# Patient Record
Sex: Male | Born: 1940 | Race: White | Hispanic: No | Marital: Married | Smoking: Current every day smoker
Health system: Southern US, Community
[De-identification: ages and names within clinical notes are randomized; demographics above are authoritative.]

## PROBLEM LIST (undated history)

## (undated) DIAGNOSIS — I10 Essential (primary) hypertension: Secondary | ICD-10-CM

## (undated) DIAGNOSIS — C801 Malignant (primary) neoplasm, unspecified: Secondary | ICD-10-CM

## (undated) HISTORY — PX: CAROTID ARTERY ANGIOPLASTY: SHX1300

## (undated) HISTORY — PX: MOHS SURGERY: SUR867

## (undated) HISTORY — PX: POLYPECTOMY: SHX149

## (undated) HISTORY — PX: FEMORAL ARTERY STENT: SHX1583

## (undated) HISTORY — PX: ABDOMINAL SURGERY: SHX537

## (undated) HISTORY — PX: LUNG REMOVAL, PARTIAL: SHX233

---

## 1998-09-24 ENCOUNTER — Other Ambulatory Visit: Admission: RE | Admit: 1998-09-24 | Discharge: 1998-09-24 | Payer: Self-pay | Admitting: *Deleted

## 1998-09-30 ENCOUNTER — Ambulatory Visit (HOSPITAL_COMMUNITY): Admission: RE | Admit: 1998-09-30 | Discharge: 1998-09-30 | Payer: Self-pay | Admitting: Gastroenterology

## 2003-09-09 ENCOUNTER — Emergency Department (HOSPITAL_COMMUNITY): Admission: EM | Admit: 2003-09-09 | Discharge: 2003-09-09 | Payer: Self-pay | Admitting: Emergency Medicine

## 2003-10-02 ENCOUNTER — Ambulatory Visit (HOSPITAL_COMMUNITY): Admission: RE | Admit: 2003-10-02 | Discharge: 2003-10-02 | Payer: Self-pay | Admitting: Orthopedic Surgery

## 2003-10-02 ENCOUNTER — Ambulatory Visit (HOSPITAL_BASED_OUTPATIENT_CLINIC_OR_DEPARTMENT_OTHER): Admission: RE | Admit: 2003-10-02 | Discharge: 2003-10-02 | Payer: Self-pay | Admitting: Orthopedic Surgery

## 2010-04-02 ENCOUNTER — Encounter: Payer: Self-pay | Admitting: Internal Medicine

## 2010-07-28 NOTE — Letter (Signed)
Summary: New Patient letter  [facility]  [address] [place], [place] [address]   Phone: [phone]  Fax: [phone]       04/02/2010 MRN: [id number]  Derek Richards [address], [place]  [address]  Dear Mr. PANGILINAN,  Welcome to the Gastroenterology Division at [facility].    You are scheduled to see Dr.  Marina Goodell on 05-12-10 at 3:30pm on the 3rd floor at [facility], [address]  We ask that you try to arrive at our office 15 minutes prior to your appointment time to allow for check-in.  We would like you to complete the enclosed self-administered evaluation form prior to your visit and bring it with you on the day of your appointment.  We will review it with you.  Also, please bring a complete list of all your medications or, if you prefer, bring the medication bottles and we will list them.  Please bring your insurance card so that we may make a copy of it.  If your insurance requires a referral to see a specialist, please bring your referral form from your primary care physician.  Co-payments are due at the time of your visit and may be paid by cash, check or credit card.     Your office visit will consist of a consult with your physician (includes a physical exam), any laboratory testing he/she may order, scheduling of any necessary diagnostic testing (e.g. x-ray, ultrasound, CT-scan), and scheduling of a procedure (e.g. Endoscopy, Colonoscopy) if required.  Please allow enough time on your schedule to allow for any/all of these possibilities.    If you cannot keep your appointment, please call [phone] to cancel or reschedule prior to your appointment date.  This allows [place] the opportunity to schedule an appointment for another patient in need of care.  If you do not cancel or reschedule by 5 p.m. the business day prior to your appointment date, you will be charged a $50.00 late cancellation/no-show fee.    Thank you for choosing [facility] for your medical needs.  We appreciate the opportunity to care for you.  Please visit [place] at our website  to learn more about our practice.                     Sincerely,                                                             The Gastroenterology Division

## 2013-02-13 DIAGNOSIS — C44320 Squamous cell carcinoma of skin of unspecified parts of face: Secondary | ICD-10-CM

## 2013-02-13 HISTORY — DX: Squamous cell carcinoma of skin of unspecified parts of face: C44.320

## 2016-12-26 DIAGNOSIS — G459 Transient cerebral ischemic attack, unspecified: Secondary | ICD-10-CM | POA: Insufficient documentation

## 2016-12-26 HISTORY — DX: Transient cerebral ischemic attack, unspecified: G45.9

## 2016-12-28 DIAGNOSIS — I70203 Unspecified atherosclerosis of native arteries of extremities, bilateral legs: Secondary | ICD-10-CM | POA: Insufficient documentation

## 2016-12-28 DIAGNOSIS — R0989 Other specified symptoms and signs involving the circulatory and respiratory systems: Secondary | ICD-10-CM | POA: Insufficient documentation

## 2016-12-28 HISTORY — DX: Other specified symptoms and signs involving the circulatory and respiratory systems: R09.89

## 2016-12-28 HISTORY — DX: Unspecified atherosclerosis of native arteries of extremities, bilateral legs: I70.203

## 2021-07-09 ENCOUNTER — Encounter: Payer: Self-pay | Admitting: Family

## 2021-07-22 ENCOUNTER — Other Ambulatory Visit: Payer: Self-pay | Admitting: Thoracic Surgery (Cardiothoracic Vascular Surgery)

## 2021-07-22 ENCOUNTER — Other Ambulatory Visit: Payer: Self-pay

## 2021-07-22 ENCOUNTER — Institutional Professional Consult (permissible substitution): Payer: Medicare Other | Admitting: Thoracic Surgery (Cardiothoracic Vascular Surgery)

## 2021-07-22 VITALS — BP 178/107 | HR 79 | Resp 20 | Ht 69.0 in | Wt 199.0 lb

## 2021-07-22 DIAGNOSIS — I10 Essential (primary) hypertension: Secondary | ICD-10-CM | POA: Insufficient documentation

## 2021-07-22 DIAGNOSIS — I6522 Occlusion and stenosis of left carotid artery: Secondary | ICD-10-CM

## 2021-07-22 DIAGNOSIS — Z9889 Other specified postprocedural states: Secondary | ICD-10-CM

## 2021-07-22 DIAGNOSIS — Z720 Tobacco use: Secondary | ICD-10-CM | POA: Diagnosis not present

## 2021-07-22 DIAGNOSIS — I739 Peripheral vascular disease, unspecified: Secondary | ICD-10-CM

## 2021-07-22 DIAGNOSIS — J439 Emphysema, unspecified: Secondary | ICD-10-CM

## 2021-07-22 DIAGNOSIS — J432 Centrilobular emphysema: Secondary | ICD-10-CM

## 2021-07-22 DIAGNOSIS — R911 Solitary pulmonary nodule: Secondary | ICD-10-CM

## 2021-07-22 HISTORY — DX: Occlusion and stenosis of left carotid artery: I65.22

## 2021-07-22 HISTORY — DX: Other specified postprocedural states: Z98.890

## 2021-07-22 HISTORY — DX: Emphysema, unspecified: J43.9

## 2021-07-22 HISTORY — DX: Tobacco use: Z72.0

## 2021-07-22 HISTORY — DX: Peripheral vascular disease, unspecified: I73.9

## 2021-07-22 NOTE — Progress Notes (Signed)
PCP is [name], NP Referring Provider is Gardiner Rhyme, MD  Chief Complaint  Patient presents with   Lung Lesion    PET 1/6, CT chest 12/23    HPI: Derek Richards is sent for consultation regarding a cavitary left upper lobe lung nodule  Derek Richards is an 81 year old man with a history of tobacco abuse, emphysema, hypertension, PAD with right carotid enterectomy and iliac stents.  Derek Richards has smoked 1 to 1-1/2 packs of cigarettes daily since Derek Richards was 81 years old (greater than 70 pack years).  Derek Richards recently started [place] for an annual physical.  With his smoking history Derek Richards was advised to have a low-dose CT for lung cancer screening.  That showed a cavitary nodule in the left upper lobe.  There was no mediastinal or hilar adenopathy.  There was some centrilobular emphysema there was also some aortic atherosclerosis.  There was a large bleb in the right lung medially.  A PET/CT showed the left upper lobe nodule was markedly hypermetabolic.  Derek Richards has referred consideration for surgical resection.  Derek Richards continues to smoke at least a pack a day.  Derek Richards is retired but remains relatively active.  Derek Richards plays golf frequently.  Derek Richards is not having any chest pain, pressure, or tightness.  Derek Richards does get short of breath with heavy exertion like working in the yard.  Derek Richards feels like Derek Richards can walk up 2 flights of stairs without stopping.  Derek Richards does not get short of breath with ambulating at a regular pace.  Derek Richards has an occasional cough.  Denies wheezing.  No change in appetite or weight loss.  No unusual headaches or visual changes.  Patient Active Problem List   Diagnosis Date Noted   Nodule of apex of left lung 08-04-2021   PAD (peripheral artery disease) ([place]) 08/04/2021   Hypertension 08-04-21   Tobacco abuse 08-04-21   Carotid stenosis, asymptomatic, left 08-04-21   History of right-sided carotid endarterectomy 08/04/21   Emphysema lung ([place]) 08/04/21   Family history Mother died of stomach cancer Father died  of throat and lung cancer   Social History Retired Tobacco abuse 1 to 1/2 packs/day x 70 years  Current Outpatient Medications  Medication Sig Dispense Refill   aspirin 81 MG EC tablet Take by mouth.     lisinopril (ZESTRIL) 5 MG tablet Take 5 mg by mouth daily.     Multiple Vitamin (MULTI-VITAMIN) tablet Take 1 tablet by mouth daily.     pantoprazole (PROTONIX) 40 MG tablet Take 40 mg by mouth daily.     No current facility-administered medications for this visit.      Review of Systems  Constitutional:  Negative for activity change, appetite change and unexpected weight change.  HENT:  Positive for dental problem (Dentures) and hearing loss. Negative for trouble swallowing and voice change.   Eyes:  Negative for visual disturbance.  Respiratory:  Positive for cough and shortness of breath. Negative for chest tightness and wheezing.   Cardiovascular:  Negative for chest pain and leg swelling.  Gastrointestinal:  Positive for abdominal pain (Reflux).  Genitourinary:  Negative for difficulty urinating and dysuria.  Musculoskeletal:  Positive for arthralgias and myalgias.  Neurological:  Negative for syncope and weakness.  Hematological:  Negative for adenopathy. Does not bruise/bleed easily.  All other systems reviewed and are negative.  BP (!) 178/107 (BP Location: Left Arm, Patient Position: Sitting)    Pulse 79    Resp 20    Ht 5\' 9"  (1.753  m)    Wt 199 lb (90.3 kg)    SpO2 95%    BMI 29.39 kg/m  Physical Exam Vitals reviewed.  Constitutional:      General: Derek Richards is not in acute distress.    Appearance: Normal appearance.  HENT:     Head: Normocephalic and atraumatic.  Eyes:     General: No scleral icterus.    Extraocular Movements: Extraocular movements intact.  Neck:     Vascular: Carotid bruit (Left bruit) present.     Comments: Well-healed scar right neck Cardiovascular:     Rate and Rhythm: Normal rate and regular rhythm.     Pulses: Normal pulses.     Heart  sounds: Normal heart sounds. No murmur heard.   No friction rub. No gallop.  Pulmonary:     Effort: No respiratory distress.     Breath sounds: Normal breath sounds. No wheezing or rales.  Abdominal:     General: There is no distension.     Palpations: Abdomen is soft.     Tenderness: There is no abdominal tenderness.  Musculoskeletal:        General: No swelling.     Cervical back: Neck supple.  Skin:    General: Skin is warm and dry.  Neurological:     General: No focal deficit present.     Mental Status: Derek Richards is alert and oriented to person, place, and time.     Cranial Nerves: No cranial nerve deficit.     Motor: No weakness.     Diagnostic Tests: I personally reviewed his CT and PET/CT.  They were done at [facility] and are accessible through our PACS system.    The CT shows a 2.4 cm spiculated cavitary mass in the left upper lobe.  No mediastinal or hilar adenopathy.  There is aortic atherosclerosis.  The PET/CT shows the left upper lobe nodule is markedly hypermetabolic.  No evidence of regional or distant metastatic disease.  Impression: Derek Richards is an 81 year old man with a history of tobacco abuse, emphysema, hypertension, PAD with right carotid enterectomy and iliac stents.  Derek Richards recently was found to have a cavitary left upper lobe lung nodule that is markedly hypermetabolic on PET/CT.  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  Infectious and inflammatory nodules are in the differential but are far less likely.  I discussed potential treatment options with Derek Richards and his wife.  They understand the options would be surgical resection versus stereotactic radiation.  We discussed the relative advantages and disadvantages of each approach.  Derek Richards strongly would prefer surgical resection.  I described the proposed procedure to him.  The plan would be to do a robotic left VATS for wedge resection and then proceed  with robotic left upper lobectomy if the frozen section shows cancer.  We discussed the general nature of the procedure including the need for general anesthesia, the incisions to be used, the use of a drainage tube postoperatively, the expected hospital stay, and the overall recovery.  I informed them of the indications, risks, benefits, and alternatives.  They understand the risks include, but not limited to death, MI, DVT, PE, bleeding, possible need for transfusion, possible conversion to open, infection, prolonged air leak, cardiac arrhythmias, pain, as well as the possibility of other unforeseeable complications.  Derek Richards does need pulmonary function testing to determine if Derek Richards has adequate pulmonary reserve.  Based on his reported activity tolerance, that should not be  an issue.  Tobacco abuse-has smoked since age 49.  Derek Richards expresses no desire to quit.  I strongly encouraged him to quit for his cardiovascular and pulmonary health.  Derek Richards has PAD, lung cancer, and emphysema.  Derek Richards understands that if Derek Richards continues to smoke it will increase his risk for perioperative complications.  Plan: Pulmonary function testing with and without bronchodilators Tentatively plan for robotic left VATS for wedge resection and possible upper lobectomy on Monday, 08/10/2021  I spent over 45 minutes in review of records, images, and in consultation with Derek Richards today. Derek Nakayama, MD [facility] [phone]

## 2021-07-22 NOTE — H&P (View-Only) (Signed)
PCP is [name], NP Referring Provider is Gardiner Rhyme, MD  Chief Complaint  Patient presents with   Lung Lesion    PET 1/6, CT chest 12/23    HPI: Derek Richards is sent for consultation regarding a cavitary left upper lobe lung nodule  Derek Richards is an 81 year old man with a history of tobacco abuse, emphysema, hypertension, PAD with right carotid enterectomy and iliac stents.  He has smoked 1 to 1-1/2 packs of cigarettes daily since he was 81 years old (greater than 70 pack years).  He recently started [place] for an annual physical.  With his smoking history he was advised to have a low-dose CT for lung cancer screening.  That showed a cavitary nodule in the left upper lobe.  There was no mediastinal or hilar adenopathy.  There was some centrilobular emphysema there was also some aortic atherosclerosis.  There was a large bleb in the right lung medially.  A PET/CT showed the left upper lobe nodule was markedly hypermetabolic.  He has referred consideration for surgical resection.  He continues to smoke at least a pack a day.  He is retired but remains relatively active.  He plays golf frequently.  He is not having any chest pain, pressure, or tightness.  He does get short of breath with heavy exertion like working in the yard.  He feels like he can walk up 2 flights of stairs without stopping.  He does not get short of breath with ambulating at a regular pace.  He has an occasional cough.  Denies wheezing.  No change in appetite or weight loss.  No unusual headaches or visual changes.  Patient Active Problem List   Diagnosis Date Noted   Nodule of apex of left lung Aug 19, 2021   PAD (peripheral artery disease) ([place]) August 19, 2021   Hypertension 08-19-21   Tobacco abuse 08/19/21   Carotid stenosis, asymptomatic, left 19-Aug-2021   History of right-sided carotid endarterectomy 2021-08-19   Emphysema lung ([place]) August 19, 2021   Family history Mother died of stomach cancer Father died  of throat and lung cancer   Social History Retired Tobacco abuse 1 to 1/2 packs/day x 70 years  Current Outpatient Medications  Medication Sig Dispense Refill   aspirin 81 MG EC tablet Take by mouth.     lisinopril (ZESTRIL) 5 MG tablet Take 5 mg by mouth daily.     Multiple Vitamin (MULTI-VITAMIN) tablet Take 1 tablet by mouth daily.     pantoprazole (PROTONIX) 40 MG tablet Take 40 mg by mouth daily.     No current facility-administered medications for this visit.      Review of Systems  Constitutional:  Negative for activity change, appetite change and unexpected weight change.  HENT:  Positive for dental problem (Dentures) and hearing loss. Negative for trouble swallowing and voice change.   Eyes:  Negative for visual disturbance.  Respiratory:  Positive for cough and shortness of breath. Negative for chest tightness and wheezing.   Cardiovascular:  Negative for chest pain and leg swelling.  Gastrointestinal:  Positive for abdominal pain (Reflux).  Genitourinary:  Negative for difficulty urinating and dysuria.  Musculoskeletal:  Positive for arthralgias and myalgias.  Neurological:  Negative for syncope and weakness.  Hematological:  Negative for adenopathy. Does not bruise/bleed easily.  All other systems reviewed and are negative.  BP (!) 178/107 (BP Location: Left Arm, Patient Position: Sitting)    Pulse 79    Resp 20    Ht 5\' 9"  (1.753  m)    Wt 199 lb (90.3 kg)    SpO2 95%    BMI 29.39 kg/m  Physical Exam Vitals reviewed.  Constitutional:      General: He is not in acute distress.    Appearance: Normal appearance.  HENT:     Head: Normocephalic and atraumatic.  Eyes:     General: No scleral icterus.    Extraocular Movements: Extraocular movements intact.  Neck:     Vascular: Carotid bruit (Left bruit) present.     Comments: Well-healed scar right neck Cardiovascular:     Rate and Rhythm: Normal rate and regular rhythm.     Pulses: Normal pulses.     Heart  sounds: Normal heart sounds. No murmur heard.   No friction rub. No gallop.  Pulmonary:     Effort: No respiratory distress.     Breath sounds: Normal breath sounds. No wheezing or rales.  Abdominal:     General: There is no distension.     Palpations: Abdomen is soft.     Tenderness: There is no abdominal tenderness.  Musculoskeletal:        General: No swelling.     Cervical back: Neck supple.  Skin:    General: Skin is warm and dry.  Neurological:     General: No focal deficit present.     Mental Status: He is alert and oriented to person, place, and time.     Cranial Nerves: No cranial nerve deficit.     Motor: No weakness.     Diagnostic Tests: I personally reviewed his CT and PET/CT.  They were done at [facility] and are accessible through our PACS system.    The CT shows a 2.4 cm spiculated cavitary mass in the left upper lobe.  No mediastinal or hilar adenopathy.  There is aortic atherosclerosis.  The PET/CT shows the left upper lobe nodule is markedly hypermetabolic.  No evidence of regional or distant metastatic disease.  Impression: Derek Richards is an 81 year old man with a history of tobacco abuse, emphysema, hypertension, PAD with right carotid enterectomy and iliac stents.  He recently was found to have a cavitary left upper lobe lung nodule that is markedly hypermetabolic on PET/CT.  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  Infectious and inflammatory nodules are in the differential but are far less likely.  I discussed potential treatment options with Mr. Shere and his wife.  They understand the options would be surgical resection versus stereotactic radiation.  We discussed the relative advantages and disadvantages of each approach.  He strongly would prefer surgical resection.  I described the proposed procedure to him.  The plan would be to do a robotic left VATS for wedge resection and then proceed  with robotic left upper lobectomy if the frozen section shows cancer.  We discussed the general nature of the procedure including the need for general anesthesia, the incisions to be used, the use of a drainage tube postoperatively, the expected hospital stay, and the overall recovery.  I informed them of the indications, risks, benefits, and alternatives.  They understand the risks include, but not limited to death, MI, DVT, PE, bleeding, possible need for transfusion, possible conversion to open, infection, prolonged air leak, cardiac arrhythmias, pain, as well as the possibility of other unforeseeable complications.  He does need pulmonary function testing to determine if he has adequate pulmonary reserve.  Based on his reported activity tolerance, that should not be  an issue.  Tobacco abuse-has smoked since age 9.  He expresses no desire to quit.  I strongly encouraged him to quit for his cardiovascular and pulmonary health.  He has PAD, lung cancer, and emphysema.  He understands that if he continues to smoke it will increase his risk for perioperative complications.  Plan: Pulmonary function testing with and without bronchodilators Tentatively plan for robotic left VATS for wedge resection and possible upper lobectomy on Monday, 08/10/2021  I spent over 45 minutes in review of records, images, and in consultation with Mr. Larusso today. [place] Nakayama, MD [facility] [phone]

## 2021-07-23 ENCOUNTER — Encounter: Payer: Self-pay | Admitting: Family

## 2021-07-23 ENCOUNTER — Encounter: Payer: Self-pay | Admitting: *Deleted

## 2021-07-23 ENCOUNTER — Other Ambulatory Visit: Payer: Self-pay | Admitting: *Deleted

## 2021-07-23 DIAGNOSIS — R911 Solitary pulmonary nodule: Secondary | ICD-10-CM

## 2021-07-29 HISTORY — PX: LUNG REMOVAL, PARTIAL: SHX233

## 2021-08-05 NOTE — Progress Notes (Signed)
Surgical Instructions    Your procedure is scheduled on Monday February 13th.  Report to [facility] Main Entrance "A" at 9:30 A.M., then check in with the Admitting office.  Call this number if you have problems the morning of surgery:  [phone]   If you have any questions prior to your surgery date call [phone]: Open Monday-Friday 8am-4pm    Remember:  Do not eat or drink after midnight the night before your surgery     Take these medicines the morning of surgery with A SIP OF WATER: lisinopril (ZESTRIL) 5 MG tablet pantoprazole (PROTONIX) 40 MG tablet   Follow your surgeon's instructions on when to stop Aspirin.  If no instructions were given by your surgeon then you will need to call the office to get those instructions.     As of today, STOP taking any Aspirin (unless otherwise instructed by your surgeon) Aleve, Naproxen, Ibuprofen, Motrin, Advil, Goody's, BC's, all herbal medications, fish oil, and all vitamins.           Do not wear jewelry  Do not wear lotions, powders, colognes, or deodorant. Do not shave 48 hours prior to surgery.  Men may shave face and neck. Do not bring valuables to the hospital. Do not wear nail polish, gel polish, artificial nails, or any other type of covering on natural nails (fingers and toes) If you have artificial nails or gel coating that need to be removed by a nail salon, please have this removed prior to surgery. Artificial nails or gel coating may interfere with anesthesia's ability to adequately monitor your vital signs.  [facility] is not responsible for any belongings or valuables. .   Do NOT Smoke (Tobacco/Vaping)  24 hours prior to your procedure  If you use a CPAP at night, you may bring your mask for your overnight stay.   Contacts, glasses, hearing aids, dentures or partials may not be worn into surgery, please bring cases for these belongings   For patients admitted to the hospital, discharge time will be determined  by your treatment team.   Patients discharged the day of surgery will not be allowed to drive home, and someone needs to stay with them for 24 hours.  NO VISITORS WILL BE ALLOWED IN PRE-OP WHERE PATIENTS ARE PREPPED FOR SURGERY.  ONLY 1 SUPPORT PERSON MAY BE PRESENT IN THE WAITING ROOM WHILE YOU ARE IN SURGERY.  IF YOU ARE TO BE ADMITTED, ONCE YOU ARE IN YOUR ROOM YOU WILL BE ALLOWED TWO (2) VISITORS. 1 (ONE) VISITOR MAY STAY OVERNIGHT BUT MUST ARRIVE TO THE ROOM BY 8pm.  Minor children may have two parents present. Special consideration for safety and communication needs will be reviewed on a case by case basis.  Special instructions:    Oral Hygiene is also important to reduce your risk of infection.  Remember - BRUSH YOUR TEETH THE MORNING OF SURGERY WITH YOUR REGULAR TOOTHPASTE   [place]- Preparing For Surgery  Before surgery, you can play an important role. Because skin is not sterile, your skin needs to be as free of germs as possible. You can reduce the number of germs on your skin by washing with CHG (chlorahexidine gluconate) Soap before surgery.  CHG is an antiseptic cleaner which kills germs and bonds with the skin to continue killing germs even after washing.     Please do not use if you have an allergy to CHG or antibacterial soaps. If your skin becomes reddened/irritated stop using the CHG.  Do not shave (including legs and underarms) for at least 48 hours prior to first CHG shower. It is OK to shave your face.  Please follow these instructions carefully.     Shower the NIGHT BEFORE SURGERY and the MORNING OF SURGERY with CHG Soap.   If you chose to wash your hair, wash your hair first as usual with your normal shampoo. After you shampoo, rinse your hair and body thoroughly to remove the shampoo.  Then [employer] and genitals (private parts) with your normal soap and rinse thoroughly to remove soap.  After that Use CHG Soap as you would any other liquid soap. You can apply  CHG directly to the skin and wash gently with a scrungie or a clean washcloth.   Apply the CHG Soap to your body ONLY FROM THE NECK DOWN.  Do not use on open wounds or open sores. Avoid contact with your eyes, ears, mouth and genitals (private parts). Wash Face and genitals (private parts)  with your normal soap.   Wash thoroughly, paying special attention to the area where your surgery will be performed.  Thoroughly rinse your body with warm water from the neck down.  DO NOT shower/wash with your normal soap after using and rinsing off the CHG Soap.  Pat yourself dry with a CLEAN TOWEL.  Wear CLEAN PAJAMAS to bed the night before surgery  Place CLEAN SHEETS on your bed the night before your surgery  DO NOT SLEEP WITH PETS.   Day of Surgery:  Take a shower with CHG soap. Wear Clean/Comfortable clothing the morning of surgery Do not apply any deodorants/lotions.   Remember to brush your teeth WITH YOUR REGULAR TOOTHPASTE.    COVID testing  If you are going to stay overnight or be admitted after your procedure/surgery and require a pre-op COVID test, please follow these instructions after your COVID test   You are not required to quarantine however you are required to wear a well-fitting mask when you are out and around people not in your household.  If your mask becomes wet or soiled, replace with a new one.  Wash your hands often with soap and water for 20 seconds or clean your hands with an alcohol-based hand sanitizer that contains at least 60% alcohol.  Do not share personal items.  Notify your provider: if you are in close contact with someone who has COVID  or if you develop a fever of 100.4 or greater, sneezing, cough, sore throat, shortness of breath or body aches.    Please read over the following fact sheets that you were given.

## 2021-08-06 ENCOUNTER — Ambulatory Visit (HOSPITAL_COMMUNITY)
Admission: RE | Admit: 2021-08-06 | Discharge: 2021-08-06 | Disposition: A | Discharge status: Home / Self Care | Payer: Medicare Other | Source: Ambulatory Visit | Attending: Thoracic Surgery (Cardiothoracic Vascular Surgery) | Admitting: Thoracic Surgery (Cardiothoracic Vascular Surgery)

## 2021-08-06 ENCOUNTER — Other Ambulatory Visit: Payer: Self-pay

## 2021-08-06 ENCOUNTER — Encounter (HOSPITAL_COMMUNITY): Payer: Self-pay

## 2021-08-06 ENCOUNTER — Encounter (HOSPITAL_COMMUNITY)
Admission: RE | Admit: 2021-08-06 | Discharge: 2021-08-06 | Disposition: A | Discharge status: Home / Self Care | Payer: Medicare Other | Source: Ambulatory Visit | Attending: Thoracic Surgery (Cardiothoracic Vascular Surgery) | Admitting: Thoracic Surgery (Cardiothoracic Vascular Surgery)

## 2021-08-06 ENCOUNTER — Ambulatory Visit (HOSPITAL_COMMUNITY): Admission: RE | Admit: 2021-08-06 | Payer: Medicare Other | Source: Ambulatory Visit

## 2021-08-06 VITALS — BP 170/90 | HR 86 | Temp 98.7°F | Resp 18 | Ht 68.0 in | Wt 200.3 lb

## 2021-08-06 DIAGNOSIS — F17210 Nicotine dependence, cigarettes, uncomplicated: Secondary | ICD-10-CM | POA: Diagnosis not present

## 2021-08-06 DIAGNOSIS — J439 Emphysema, unspecified: Secondary | ICD-10-CM | POA: Diagnosis not present

## 2021-08-06 DIAGNOSIS — R911 Solitary pulmonary nodule: Secondary | ICD-10-CM

## 2021-08-06 DIAGNOSIS — Z20822 Contact with and (suspected) exposure to covid-19: Secondary | ICD-10-CM | POA: Insufficient documentation

## 2021-08-06 DIAGNOSIS — I10 Essential (primary) hypertension: Secondary | ICD-10-CM | POA: Diagnosis not present

## 2021-08-06 DIAGNOSIS — J984 Other disorders of lung: Secondary | ICD-10-CM | POA: Insufficient documentation

## 2021-08-06 DIAGNOSIS — I739 Peripheral vascular disease, unspecified: Secondary | ICD-10-CM | POA: Insufficient documentation

## 2021-08-06 DIAGNOSIS — Z01818 Encounter for other preprocedural examination: Secondary | ICD-10-CM | POA: Diagnosis not present

## 2021-08-06 HISTORY — DX: Malignant (primary) neoplasm, unspecified: C80.1

## 2021-08-06 HISTORY — DX: Essential (primary) hypertension: I10

## 2021-08-06 LAB — BLOOD GAS, ARTERIAL
Acid-Base Excess: 0.4 mmol/L (ref 0.0–2.0)
Bicarbonate: 24.4 mmol/L (ref 20.0–28.0)
FIO2: 21
O2 Saturation: 97.3 %
Patient temperature: 37
pCO2 arterial: 39.1 mmHg (ref 32.0–48.0)
pH, Arterial: 7.412 (ref 7.350–7.450)
pO2, Arterial: 101 mmHg (ref 83.0–108.0)

## 2021-08-06 LAB — URINALYSIS, ROUTINE W REFLEX MICROSCOPIC
Bilirubin Urine: NEGATIVE
Glucose, UA: NEGATIVE mg/dL
Hgb urine dipstick: NEGATIVE
Ketones, ur: NEGATIVE mg/dL
Leukocytes,Ua: NEGATIVE
Nitrite: NEGATIVE
Protein, ur: NEGATIVE mg/dL
Specific Gravity, Urine: 1.002 — ABNORMAL LOW (ref 1.005–1.030)
pH: 6 (ref 5.0–8.0)

## 2021-08-06 LAB — COMPREHENSIVE METABOLIC PANEL
ALT: 23 U/L (ref 0–44)
AST: 24 U/L (ref 15–41)
Albumin: 4 g/dL (ref 3.5–5.0)
Alkaline Phosphatase: 61 U/L (ref 38–126)
Anion gap: 11 (ref 5–15)
BUN: 9 mg/dL (ref 8–23)
CO2: 22 mmol/L (ref 22–32)
Calcium: 9 mg/dL (ref 8.9–10.3)
Chloride: 96 mmol/L — ABNORMAL LOW (ref 98–111)
Creatinine, Ser: 0.95 mg/dL (ref 0.61–1.24)
GFR, Estimated: >60 mL/min (ref >60)
Glucose, Bld: 89 mg/dL (ref 70–99)
Potassium: 4.4 mmol/L (ref 3.5–5.1)
Sodium: 129 mmol/L — ABNORMAL LOW (ref 135–145)
Total Bilirubin: 0.6 mg/dL (ref 0.3–1.2)
Total Protein: 6.6 g/dL (ref 6.5–8.1)

## 2021-08-06 LAB — SURGICAL PCR SCREEN
MRSA, PCR: NEGATIVE
Staphylococcus aureus: NEGATIVE

## 2021-08-06 LAB — TYPE AND SCREEN
ABO/RH(D): A POS
Antibody Screen: NEGATIVE

## 2021-08-06 LAB — CBC
HCT: 43.9 % (ref 39.0–52.0)
Hemoglobin: 15.6 g/dL (ref 13.0–17.0)
MCH: 32.8 pg (ref 26.0–34.0)
MCHC: 35.5 g/dL (ref 30.0–36.0)
MCV: 92.2 fL (ref 80.0–100.0)
Platelets: 223 10*3/uL (ref 150–400)
RBC: 4.76 MIL/uL (ref 4.22–5.81)
RDW: 12.3 % (ref 11.5–15.5)
WBC: 6.1 10*3/uL (ref 4.0–10.5)
nRBC: 0 % (ref 0.0–0.2)

## 2021-08-06 LAB — APTT: aPTT: 25 seconds (ref 24–36)

## 2021-08-06 LAB — PROTIME-INR
INR: 0.9 (ref 0.8–1.2)
Prothrombin Time: 11.7 seconds (ref 11.4–15.2)

## 2021-08-06 NOTE — Progress Notes (Addendum)
Surgical Instructions    Your procedure is scheduled on Monday February 13th.  Report to [facility] Main Entrance "A" at 9:30 A.M., then check in with the Admitting office.  Call this number if you have problems the morning of surgery:  [phone]   If you have any questions prior to your surgery date call [phone]: Open Monday-Friday 8am-4pm    Remember:  Do not eat or drink after midnight the night before your surgery     Take these medicines the morning of surgery with A SIP OF WATER: pantoprazole (PROTONIX) 40 MG tablet   Follow your surgeon's instructions on when to stop Aspirin.  If no instructions were given by your surgeon then you will need to call the office to get those instructions.     As of today, STOP taking any Aspirin (unless otherwise instructed by your surgeon) Aleve, Naproxen, Ibuprofen, Motrin, Advil, Goody's, BC's, all herbal medications, fish oil, and all vitamins.           Do not wear jewelry  Do not wear lotions, powders, colognes, or deodorant. Do not shave 48 hours prior to surgery.  Men may shave face and neck. Do not bring valuables to the hospital.  [facility] is not responsible for any belongings or valuables. .   Do NOT Smoke (Tobacco/Vaping)  24 hours prior to your procedure  If you use a CPAP at night, you may bring your mask for your overnight stay.   Contacts, glasses, hearing aids, dentures or partials may not be worn into surgery, please bring cases for these belongings   For patients admitted to the hospital, discharge time will be determined by your treatment team.   Patients discharged the day of surgery will not be allowed to drive home, and someone needs to stay with them for 24 hours.  NO VISITORS WILL BE ALLOWED IN PRE-OP WHERE PATIENTS ARE PREPPED FOR SURGERY.  ONLY 1 SUPPORT PERSON MAY BE PRESENT IN THE WAITING ROOM WHILE YOU ARE IN SURGERY.  IF YOU ARE TO BE ADMITTED, ONCE YOU ARE IN YOUR ROOM YOU WILL BE ALLOWED TWO  (2) VISITORS. 1 (ONE) VISITOR MAY STAY OVERNIGHT BUT MUST ARRIVE TO THE ROOM BY 8pm.  Minor children may have two parents present. Special consideration for safety and communication needs will be reviewed on a case by case basis.  Special instructions:    Oral Hygiene is also important to reduce your risk of infection.  Remember - BRUSH YOUR TEETH THE MORNING OF SURGERY WITH YOUR REGULAR TOOTHPASTE   [place]- Preparing For Surgery  Before surgery, you can play an important role. Because skin is not sterile, your skin needs to be as free of germs as possible. You can reduce the number of germs on your skin by washing with CHG (chlorahexidine gluconate) Soap before surgery.  CHG is an antiseptic cleaner which kills germs and bonds with the skin to continue killing germs even after washing.     Please do not use if you have an allergy to CHG or antibacterial soaps. If your skin becomes reddened/irritated stop using the CHG.  Do not shave (including legs and underarms) for at least 48 hours prior to first CHG shower. It is OK to shave your face.  Please follow these instructions carefully.     Shower the NIGHT BEFORE SURGERY and the MORNING OF SURGERY with CHG Soap.   If you chose to wash your hair, wash your hair first as usual with your normal  shampoo. After you shampoo, rinse your hair and body thoroughly to remove the shampoo.  Then [employer] and genitals (private parts) with your normal soap and rinse thoroughly to remove soap.  After that Use CHG Soap as you would any other liquid soap. You can apply CHG directly to the skin and wash gently with a scrungie or a clean washcloth.   Apply the CHG Soap to your body ONLY FROM THE NECK DOWN.  Do not use on open wounds or open sores. Avoid contact with your eyes, ears, mouth and genitals (private parts). Wash Face and genitals (private parts)  with your normal soap.   Wash thoroughly, paying special attention to the area where your surgery  will be performed.  Thoroughly rinse your body with warm water from the neck down.  DO NOT shower/wash with your normal soap after using and rinsing off the CHG Soap.  Pat yourself dry with a CLEAN TOWEL.  Wear CLEAN PAJAMAS to bed the night before surgery  Place CLEAN SHEETS on your bed the night before your surgery  DO NOT SLEEP WITH PETS.   Day of Surgery:  Take a shower with CHG soap. Wear Clean/Comfortable clothing the morning of surgery Do not apply any deodorants/lotions.   Remember to brush your teeth WITH YOUR REGULAR TOOTHPASTE.    COVID testing  If you are going to stay overnight or be admitted after your procedure/surgery and require a pre-op COVID test, please follow these instructions after your COVID test   You are not required to quarantine however you are required to wear a well-fitting mask when you are out and around people not in your household.  If your mask becomes wet or soiled, replace with a new one.  Wash your hands often with soap and water for 20 seconds or clean your hands with an alcohol-based hand sanitizer that contains at least 60% alcohol.  Do not share personal items.  Notify your provider: if you are in close contact with someone who has COVID  or if you develop a fever of 100.4 or greater, sneezing, cough, sore throat, shortness of breath or body aches.    Please read over the following fact sheets that you were given.

## 2021-08-06 NOTE — Progress Notes (Signed)
PCP - Heide Scales NP Cardiologist - Dr. Mariea Stable  PPM/ICD - Denies  Chest x-ray - 08/06/21 EKG - 08/06/21 Stress Test - Denies ECHO - Denies Cardiac Cath - Denies  Sleep Study - Denies  Patient denies having diabetes.  Blood Thinner Instructions: N/A Aspirin Instructions: Follow surgeon's instructions.  ERAS Protcol - No  COVID TEST- 08/06/21 in PAT   Anesthesia review: Yes, cardiac hx  Patient denies shortness of breath, fever, cough and chest pain at PAT appointment   All instructions explained to the patient, with a verbal understanding of the material. Patient agrees to go over the instructions while at home for a better understanding. Patient also instructed to self quarantine after being tested for COVID-19. The opportunity to ask questions was provided.

## 2021-08-07 ENCOUNTER — Ambulatory Visit (HOSPITAL_COMMUNITY)
Admission: RE | Admit: 2021-08-07 | Discharge: 2021-08-07 | Disposition: A | Discharge status: Home / Self Care | Payer: Medicare Other | Source: Ambulatory Visit | Attending: Thoracic Surgery (Cardiothoracic Vascular Surgery) | Admitting: Thoracic Surgery (Cardiothoracic Vascular Surgery)

## 2021-08-07 DIAGNOSIS — R911 Solitary pulmonary nodule: Secondary | ICD-10-CM

## 2021-08-07 LAB — PULMONARY FUNCTION TEST
DL/VA % pred: 74 %
DL/VA: 2.89 ml/min/mmHg/L
DLCO cor % pred: 74 %
DLCO cor: 17.6 ml/min/mmHg
DLCO unc % pred: 76 %
DLCO unc: 18.21 ml/min/mmHg
FEF 25-75 Post: 1.41 L/sec
FEF 25-75 Pre: 1.49 L/sec
FEF2575-%Change-Post: -5 %
FEF2575-%Pred-Post: 74 %
FEF2575-%Pred-Pre: 79 %
FEV1-%Change-Post: -1 %
FEV1-%Pred-Post: 105 %
FEV1-%Pred-Pre: 106 %
FEV1-Post: 2.88 L
FEV1-Pre: 2.92 L
FEV1FVC-%Change-Post: 1 %
FEV1FVC-%Pred-Pre: 92 %
FEV6-%Change-Post: -2 %
FEV6-%Pred-Post: 111 %
FEV6-%Pred-Pre: 114 %
FEV6-Post: 4.01 L
FEV6-Pre: 4.12 L
FEV6FVC-%Change-Post: 0 %
FEV6FVC-%Pred-Post: 101 %
FEV6FVC-%Pred-Pre: 100 %
FVC-%Change-Post: -2 %
FVC-%Pred-Post: 110 %
FVC-%Pred-Pre: 113 %
FVC-Post: 4.26 L
FVC-Pre: 4.39 L
Post FEV1/FVC ratio: 68 %
Post FEV6/FVC ratio: 94 %
Pre FEV1/FVC ratio: 66 %
Pre FEV6/FVC Ratio: 94 %
RV % pred: 95 %
RV: 2.49 L
TLC % pred: 99 %
TLC: 6.84 L

## 2021-08-07 LAB — SARS CORONAVIRUS 2 (TAT 6-24 HRS): SARS Coronavirus 2: NEGATIVE

## 2021-08-07 MED ORDER — ALBUTEROL SULFATE (2.5 MG/3ML) 0.083% IN NEBU
2.5000 mg | INHALATION_SOLUTION | Freq: Once | RESPIRATORY_TRACT | Status: AC
  Administered 2021-08-07: 2.5 mg via RESPIRATORY_TRACT
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
## 2021-08-07 NOTE — Anesthesia Preprocedure Evaluation (Addendum)
Anesthesia Evaluation  Patient identified by MRN, date of birth, ID band Patient awake    Reviewed: Allergy & Precautions, NPO status , Patient's Chart, lab work & pertinent test results  History of Anesthesia Complications Negative for: history of anesthetic complications  Airway Mallampati: II  TM Distance: >3 FB Neck ROM: Full    Dental  (+) Edentulous Upper, Edentulous Lower   Pulmonary COPD, Current Smoker and Patient abstained from smoking.,  Lung nodule   breath sounds clear to auscultation       Cardiovascular hypertension, Pt. on medications + Peripheral Vascular Disease (s/p CEA)   Rhythm:Regular Rate:Normal     Neuro/Psych negative neurological ROS     GI/Hepatic Neg liver ROS, GERD  Medicated and Controlled,  Endo/Other  negative endocrine ROS  Renal/GU negative Renal ROS     Musculoskeletal negative musculoskeletal ROS (+)   Abdominal   Peds  Hematology negative hematology ROS (+)   Anesthesia Other Findings   Reproductive/Obstetrics                           Anesthesia Physical Anesthesia Plan  ASA: 3  Anesthesia Plan: General   Post-op Pain Management:    Induction: Intravenous  PONV Risk Score and Plan: Ondansetron, Dexamethasone and Midazolam  Airway Management Planned: Double Lumen EBT and Oral ETT  Additional Equipment: Arterial line  Intra-op Plan:   Post-operative Plan: Extubation in OR  Informed Consent: I have reviewed the patients History and Physical, chart, labs and discussed the procedure including the risks, benefits and alternatives for the proposed anesthesia with the patient or authorized representative who has indicated his/her understanding and acceptance.     Dental advisory given  Plan Discussed with: CRNA, Anesthesiologist and Surgeon  Anesthesia Plan Comments: (See APP note by Durel Salts, FNP )      Anesthesia Quick  Evaluation

## 2021-08-10 ENCOUNTER — Encounter (HOSPITAL_COMMUNITY)
Admission: RE | Disposition: A | Discharge status: Home / Self Care | Payer: Self-pay | Source: Home / Self Care | Attending: Thoracic Surgery (Cardiothoracic Vascular Surgery)

## 2021-08-10 ENCOUNTER — Encounter (HOSPITAL_COMMUNITY): Payer: Self-pay | Admitting: Thoracic Surgery (Cardiothoracic Vascular Surgery)

## 2021-08-10 ENCOUNTER — Inpatient Hospital Stay (HOSPITAL_COMMUNITY): Payer: Medicare Other

## 2021-08-10 ENCOUNTER — Other Ambulatory Visit: Payer: Self-pay

## 2021-08-10 ENCOUNTER — Inpatient Hospital Stay (HOSPITAL_COMMUNITY): Payer: Medicare Other | Admitting: Anesthesiology

## 2021-08-10 ENCOUNTER — Inpatient Hospital Stay (HOSPITAL_COMMUNITY)
Admission: RE | Admit: 2021-08-10 | Discharge: 2021-08-13 | DRG: 164 | Disposition: A | Discharge status: Home / Self Care | Payer: Medicare Other | Attending: Thoracic Surgery (Cardiothoracic Vascular Surgery) | Admitting: Thoracic Surgery (Cardiothoracic Vascular Surgery)

## 2021-08-10 ENCOUNTER — Inpatient Hospital Stay (HOSPITAL_COMMUNITY): Payer: Medicare Other | Admitting: Emergency Medicine

## 2021-08-10 DIAGNOSIS — D62 Acute posthemorrhagic anemia: Secondary | ICD-10-CM | POA: Diagnosis present

## 2021-08-10 DIAGNOSIS — Z801 Family history of malignant neoplasm of trachea, bronchus and lung: Secondary | ICD-10-CM | POA: Diagnosis not present

## 2021-08-10 DIAGNOSIS — J449 Chronic obstructive pulmonary disease, unspecified: Secondary | ICD-10-CM

## 2021-08-10 DIAGNOSIS — Z79899 Other long term (current) drug therapy: Secondary | ICD-10-CM

## 2021-08-10 DIAGNOSIS — I700 Atherosclerosis of aorta: Secondary | ICD-10-CM | POA: Diagnosis present

## 2021-08-10 DIAGNOSIS — Z634 Disappearance and death of family member: Secondary | ICD-10-CM

## 2021-08-10 DIAGNOSIS — Z800 Family history of malignant neoplasm of digestive organs: Secondary | ICD-10-CM

## 2021-08-10 DIAGNOSIS — J432 Centrilobular emphysema: Secondary | ICD-10-CM | POA: Diagnosis present

## 2021-08-10 DIAGNOSIS — Z7982 Long term (current) use of aspirin: Secondary | ICD-10-CM | POA: Diagnosis not present

## 2021-08-10 DIAGNOSIS — E871 Hypo-osmolality and hyponatremia: Secondary | ICD-10-CM | POA: Diagnosis not present

## 2021-08-10 DIAGNOSIS — I454 Nonspecific intraventricular block: Secondary | ICD-10-CM | POA: Diagnosis present

## 2021-08-10 DIAGNOSIS — C3412 Malignant neoplasm of upper lobe, left bronchus or lung: Principal | ICD-10-CM | POA: Diagnosis present

## 2021-08-10 DIAGNOSIS — I10 Essential (primary) hypertension: Secondary | ICD-10-CM | POA: Diagnosis present

## 2021-08-10 DIAGNOSIS — K219 Gastro-esophageal reflux disease without esophagitis: Secondary | ICD-10-CM

## 2021-08-10 DIAGNOSIS — C779 Secondary and unspecified malignant neoplasm of lymph node, unspecified: Secondary | ICD-10-CM | POA: Diagnosis present

## 2021-08-10 DIAGNOSIS — F17210 Nicotine dependence, cigarettes, uncomplicated: Secondary | ICD-10-CM | POA: Diagnosis present

## 2021-08-10 DIAGNOSIS — I739 Peripheral vascular disease, unspecified: Secondary | ICD-10-CM | POA: Diagnosis present

## 2021-08-10 DIAGNOSIS — J9382 Other air leak: Secondary | ICD-10-CM | POA: Diagnosis present

## 2021-08-10 DIAGNOSIS — R911 Solitary pulmonary nodule: Secondary | ICD-10-CM | POA: Diagnosis present

## 2021-08-10 DIAGNOSIS — J939 Pneumothorax, unspecified: Secondary | ICD-10-CM

## 2021-08-10 DIAGNOSIS — C3492 Malignant neoplasm of unspecified part of left bronchus or lung: Secondary | ICD-10-CM | POA: Diagnosis present

## 2021-08-10 HISTORY — PX: INTERCOSTAL NERVE BLOCK: SHX5021

## 2021-08-10 HISTORY — PX: NODE DISSECTION: SHX5269

## 2021-08-10 LAB — ABO/RH: ABO/RH(D): A POS

## 2021-08-10 SURGERY — WEDGE RESECTION, LUNG, ROBOT-ASSISTED, THORACOSCOPIC
Anesthesia: General | Site: Chest | Laterality: Left

## 2021-08-10 MED ORDER — BUPIVACAINE LIPOSOME 1.3 % IJ SUSP
INTRAMUSCULAR | Status: AC
  Filled 2021-08-10: qty 20

## 2021-08-10 MED ORDER — PROPOFOL 10 MG/ML IV BOLUS
INTRAVENOUS | Status: AC
  Filled 2021-08-10: qty 20

## 2021-08-10 MED ORDER — MIDAZOLAM HCL 2 MG/2ML IJ SOLN: 0.5000 mg | Freq: Once | INTRAMUSCULAR | Status: DC | PRN

## 2021-08-10 MED ORDER — FENTANYL CITRATE (PF) 250 MCG/5ML IJ SOLN
INTRAMUSCULAR | Status: AC
  Filled 2021-08-10: qty 5

## 2021-08-10 MED ORDER — ADULT MULTIVITAMIN W/MINERALS CH
1.0000 | ORAL_TABLET | Freq: Every day | ORAL | Status: DC
  Administered 2021-08-11 – 2021-08-13 (×3): 1 via ORAL
  Filled 2021-08-10 (×3): qty 1

## 2021-08-10 MED ORDER — KETOROLAC TROMETHAMINE 15 MG/ML IJ SOLN
15.0000 mg | Freq: Four times a day (QID) | INTRAMUSCULAR | Status: DC
  Administered 2021-08-10 – 2021-08-11 (×3): 15 mg via INTRAVENOUS
  Filled 2021-08-10 (×3): qty 1

## 2021-08-10 MED ORDER — ONDANSETRON HCL 4 MG/2ML IJ SOLN
INTRAMUSCULAR | Status: AC
  Filled 2021-08-10: qty 2

## 2021-08-10 MED ORDER — BISACODYL 5 MG PO TBEC
10.0000 mg | DELAYED_RELEASE_TABLET | Freq: Every day | ORAL | Status: DC
  Administered 2021-08-10: 10 mg via ORAL
  Filled 2021-08-10 (×4): qty 2

## 2021-08-10 MED ORDER — SODIUM CHLORIDE 0.9 % IV SOLN
INTRAVENOUS | Status: DC | PRN
Start: 2021-08-10 — End: 2021-08-10

## 2021-08-10 MED ORDER — TRAMADOL HCL 50 MG PO TABS
50.0000 mg | ORAL_TABLET | Freq: Four times a day (QID) | ORAL | Status: DC | PRN
  Administered 2021-08-11: 50 mg via ORAL
  Filled 2021-08-10: qty 1

## 2021-08-10 MED ORDER — ACETAMINOPHEN 160 MG/5ML PO SOLN: 1000.0000 mg | Freq: Four times a day (QID) | ORAL | Status: DC

## 2021-08-10 MED ORDER — SUGAMMADEX SODIUM 200 MG/2ML IV SOLN
INTRAVENOUS | Status: DC | PRN
Start: 2021-08-10 — End: 2021-08-10
  Administered 2021-08-10: 200 mg via INTRAVENOUS

## 2021-08-10 MED ORDER — LIDOCAINE 2% (20 MG/ML) 5 ML SYRINGE
INTRAMUSCULAR | Status: DC | PRN
  Administered 2021-08-10: 40 mg via INTRAVENOUS

## 2021-08-10 MED ORDER — OXYCODONE HCL 5 MG PO TABS: 5.0000 mg | ORAL_TABLET | Freq: Once | ORAL | Status: DC | PRN

## 2021-08-10 MED ORDER — 0.9 % SODIUM CHLORIDE (POUR BTL) OPTIME
TOPICAL | Status: DC | PRN
  Administered 2021-08-10: 2000 mL

## 2021-08-10 MED ORDER — SODIUM CHLORIDE 0.9 % IV SOLN: INTRAVENOUS | Status: DC

## 2021-08-10 MED ORDER — PHENYLEPHRINE 40 MCG/ML (10ML) SYRINGE FOR IV PUSH (FOR BLOOD PRESSURE SUPPORT)
PREFILLED_SYRINGE | INTRAVENOUS | Status: DC | PRN
  Administered 2021-08-10 (×2): 120 ug via INTRAVENOUS

## 2021-08-10 MED ORDER — DEXAMETHASONE SODIUM PHOSPHATE 10 MG/ML IJ SOLN
INTRAMUSCULAR | Status: DC | PRN
Start: 2021-08-10 — End: 2021-08-10
  Administered 2021-08-10: 10 mg via INTRAVENOUS

## 2021-08-10 MED ORDER — ORAL CARE MOUTH RINSE: 15.0000 mL | Freq: Once | OROMUCOSAL | Status: AC

## 2021-08-10 MED ORDER — EPHEDRINE SULFATE-NACL 50-0.9 MG/10ML-% IV SOSY
PREFILLED_SYRINGE | INTRAVENOUS | Status: DC | PRN
  Administered 2021-08-10: 10 mg via INTRAVENOUS

## 2021-08-10 MED ORDER — PROMETHAZINE HCL 25 MG/ML IJ SOLN: 6.2500 mg | INTRAMUSCULAR | Status: DC | PRN

## 2021-08-10 MED ORDER — MIDAZOLAM HCL 2 MG/2ML IJ SOLN
INTRAMUSCULAR | Status: DC | PRN
  Administered 2021-08-10: 2 mg via INTRAVENOUS

## 2021-08-10 MED ORDER — OXYCODONE HCL 5 MG/5ML PO SOLN: 5.0000 mg | Freq: Once | ORAL | Status: DC | PRN

## 2021-08-10 MED ORDER — PANTOPRAZOLE SODIUM 40 MG PO TBEC
40.0000 mg | DELAYED_RELEASE_TABLET | Freq: Every day | ORAL | Status: DC
  Administered 2021-08-11 – 2021-08-13 (×3): 40 mg via ORAL
  Filled 2021-08-10 (×3): qty 1

## 2021-08-10 MED ORDER — MIDAZOLAM HCL 2 MG/2ML IJ SOLN
INTRAMUSCULAR | Status: AC
  Filled 2021-08-10: qty 2

## 2021-08-10 MED ORDER — HEMOSTATIC AGENTS (NO CHARGE) OPTIME
TOPICAL | Status: DC | PRN
  Administered 2021-08-10: 1 via TOPICAL

## 2021-08-10 MED ORDER — ONDANSETRON HCL 4 MG/2ML IJ SOLN: 4.0000 mg | Freq: Four times a day (QID) | INTRAMUSCULAR | Status: DC | PRN

## 2021-08-10 MED ORDER — SENNOSIDES-DOCUSATE SODIUM 8.6-50 MG PO TABS
1.0000 | ORAL_TABLET | Freq: Every day | ORAL | Status: DC
  Administered 2021-08-10 – 2021-08-12 (×3): 1 via ORAL
  Filled 2021-08-10 (×3): qty 1

## 2021-08-10 MED ORDER — ROCURONIUM BROMIDE 10 MG/ML (PF) SYRINGE
PREFILLED_SYRINGE | INTRAVENOUS | Status: DC | PRN
Start: 2021-08-10 — End: 2021-08-10
  Administered 2021-08-10: 20 mg via INTRAVENOUS
  Administered 2021-08-10 (×3): 10 mg via INTRAVENOUS
  Administered 2021-08-10: 60 mg via INTRAVENOUS
  Administered 2021-08-10 (×2): 10 mg via INTRAVENOUS

## 2021-08-10 MED ORDER — LIDOCAINE 2% (20 MG/ML) 5 ML SYRINGE
INTRAMUSCULAR | Status: AC
  Filled 2021-08-10: qty 5

## 2021-08-10 MED ORDER — ROCURONIUM BROMIDE 10 MG/ML (PF) SYRINGE
PREFILLED_SYRINGE | INTRAVENOUS | Status: AC
  Filled 2021-08-10: qty 10

## 2021-08-10 MED ORDER — PHENYLEPHRINE HCL-NACL 20-0.9 MG/250ML-% IV SOLN
INTRAVENOUS | Status: DC | PRN
  Administered 2021-08-10: 20 ug/min via INTRAVENOUS

## 2021-08-10 MED ORDER — BUPIVACAINE HCL (PF) 0.5 % IJ SOLN
INTRAMUSCULAR | Status: AC
  Filled 2021-08-10: qty 30

## 2021-08-10 MED ORDER — CEFAZOLIN SODIUM-DEXTROSE 2-4 GM/100ML-% IV SOLN
2.0000 g | Freq: Three times a day (TID) | INTRAVENOUS | Status: AC
  Administered 2021-08-10 – 2021-08-11 (×2): 2 g via INTRAVENOUS
  Filled 2021-08-10 (×3): qty 100

## 2021-08-10 MED ORDER — DEXAMETHASONE SODIUM PHOSPHATE 10 MG/ML IJ SOLN
INTRAMUSCULAR | Status: AC
  Filled 2021-08-10: qty 1

## 2021-08-10 MED ORDER — SODIUM CHLORIDE FLUSH 0.9 % IV SOLN
INTRAVENOUS | Status: DC | PRN
  Administered 2021-08-10: 100 mL

## 2021-08-10 MED ORDER — ASPIRIN EC 81 MG PO TBEC
81.0000 mg | DELAYED_RELEASE_TABLET | Freq: Every day | ORAL | Status: DC
  Administered 2021-08-11 – 2021-08-13 (×3): 81 mg via ORAL
  Filled 2021-08-10 (×3): qty 1

## 2021-08-10 MED ORDER — MEPERIDINE HCL 25 MG/ML IJ SOLN: 6.2500 mg | INTRAMUSCULAR | Status: DC | PRN

## 2021-08-10 MED ORDER — LISINOPRIL 5 MG PO TABS
5.0000 mg | ORAL_TABLET | Freq: Every day | ORAL | Status: DC
  Administered 2021-08-11 – 2021-08-13 (×3): 5 mg via ORAL
  Filled 2021-08-10 (×3): qty 1

## 2021-08-10 MED ORDER — EPHEDRINE 5 MG/ML INJ
INTRAVENOUS | Status: AC
  Filled 2021-08-10: qty 5

## 2021-08-10 MED ORDER — NAPHAZOLINE-GLYCERIN 0.012-0.25 % OP SOLN
1.0000 [drp] | Freq: Four times a day (QID) | OPHTHALMIC | Status: DC | PRN
  Administered 2021-08-10: 2 [drp] via OPHTHALMIC
  Filled 2021-08-10: qty 15

## 2021-08-10 MED ORDER — ENOXAPARIN SODIUM 40 MG/0.4ML IJ SOSY
40.0000 mg | PREFILLED_SYRINGE | INTRAMUSCULAR | Status: DC
  Administered 2021-08-12: 40 mg via SUBCUTANEOUS
  Filled 2021-08-10 (×2): qty 0.4

## 2021-08-10 MED ORDER — LACTATED RINGERS IV SOLN: INTRAVENOUS | Status: DC

## 2021-08-10 MED ORDER — CHLORHEXIDINE GLUCONATE 0.12 % MT SOLN
15.0000 mL | Freq: Once | OROMUCOSAL | Status: AC
  Administered 2021-08-10: 15 mL via OROMUCOSAL
  Filled 2021-08-10: qty 15

## 2021-08-10 MED ORDER — FENTANYL CITRATE (PF) 100 MCG/2ML IJ SOLN
INTRAMUSCULAR | Status: DC | PRN
  Administered 2021-08-10 (×2): 50 ug via INTRAVENOUS
  Administered 2021-08-10: 250 ug via INTRAVENOUS

## 2021-08-10 MED ORDER — PROPOFOL 10 MG/ML IV BOLUS
INTRAVENOUS | Status: DC | PRN
Start: 2021-08-10 — End: 2021-08-10
  Administered 2021-08-10: 30 mg via INTRAVENOUS
  Administered 2021-08-10: 150 mg via INTRAVENOUS
  Administered 2021-08-10: 20 mg via INTRAVENOUS

## 2021-08-10 MED ORDER — FENTANYL CITRATE PF 50 MCG/ML IJ SOSY: 25.0000 ug | PREFILLED_SYRINGE | INTRAMUSCULAR | Status: DC | PRN

## 2021-08-10 MED ORDER — HYDROMORPHONE HCL 1 MG/ML IJ SOLN: 0.2500 mg | INTRAMUSCULAR | Status: DC | PRN

## 2021-08-10 MED ORDER — CEFAZOLIN SODIUM-DEXTROSE 2-4 GM/100ML-% IV SOLN
2.0000 g | INTRAVENOUS | Status: AC
  Administered 2021-08-10 (×2): 2 g via INTRAVENOUS
  Filled 2021-08-10: qty 100

## 2021-08-10 MED ORDER — SODIUM CHLORIDE 0.9 % IV SOLN
INTRAVENOUS | Status: AC | PRN
  Administered 2021-08-10: 1000 mL

## 2021-08-10 MED ORDER — ACETAMINOPHEN 500 MG PO TABS
1000.0000 mg | ORAL_TABLET | Freq: Four times a day (QID) | ORAL | Status: DC
  Administered 2021-08-10 – 2021-08-13 (×9): 1000 mg via ORAL
  Filled 2021-08-10 (×10): qty 2

## 2021-08-10 MED ORDER — PHENYLEPHRINE 40 MCG/ML (10ML) SYRINGE FOR IV PUSH (FOR BLOOD PRESSURE SUPPORT)
PREFILLED_SYRINGE | INTRAVENOUS | Status: AC
  Filled 2021-08-10: qty 10

## 2021-08-10 SURGICAL SUPPLY — 115 items
APPLIER CLIP ROT 10 11.4 M/L (STAPLE)
BLADE CLIPPER SURG (BLADE) ×2 IMPLANT
BNDG COHESIVE 6X5 TAN STRL LF (GAUZE/BANDAGES/DRESSINGS) IMPLANT
CANISTER SUCT 3000ML PPV (MISCELLANEOUS) ×4 IMPLANT
CANNULA REDUC XI 12-8 STAPL (CANNULA) ×2
CANNULA REDUCER 12-8 DVNC XI (CANNULA) ×2 IMPLANT
CLIP APPLIE ROT 10 11.4 M/L (STAPLE) IMPLANT
CNTNR URN SCR LID CUP LEK RST (MISCELLANEOUS) ×5 IMPLANT
CONN ST 1/4X3/8  BEN (MISCELLANEOUS) ×2
CONN ST 1/4X3/8 BEN (MISCELLANEOUS) IMPLANT
CONT SPEC 4OZ STRL OR WHT (MISCELLANEOUS) ×34
DEFOGGER SCOPE WARMER CLEARIFY (MISCELLANEOUS) ×2 IMPLANT
DERMABOND ADVANCED (GAUZE/BANDAGES/DRESSINGS) ×1
DERMABOND ADVANCED .7 DNX12 (GAUZE/BANDAGES/DRESSINGS) ×1 IMPLANT
DRAIN CHANNEL 28F RND 3/8 FF (WOUND CARE) IMPLANT
DRAIN CHANNEL 32F RND 10.7 FF (WOUND CARE) IMPLANT
DRAPE ARM DVNC X/XI (DISPOSABLE) ×4 IMPLANT
DRAPE COLUMN DVNC XI (DISPOSABLE) ×1 IMPLANT
DRAPE CV SPLIT W-CLR ANES SCRN (DRAPES) ×2 IMPLANT
DRAPE DA VINCI XI ARM (DISPOSABLE) ×4
DRAPE DA VINCI XI COLUMN (DISPOSABLE) ×2
DRAPE HALF SHEET 40X57 (DRAPES) ×3 IMPLANT
DRAPE INCISE IOBAN 66X45 STRL (DRAPES) ×1 IMPLANT
DRAPE ORTHO SPLIT 77X108 STRL (DRAPES) ×1
DRAPE SURG ORHT 6 SPLT 77X108 (DRAPES) ×1 IMPLANT
ELECT BLADE 6.5 EXT (BLADE) IMPLANT
ELECT REM PT RETURN 9FT ADLT (ELECTROSURGICAL) ×2
ELECTRODE REM PT RTRN 9FT ADLT (ELECTROSURGICAL) ×1 IMPLANT
GAUZE 4X4 16PLY ~~LOC~~+RFID DBL (SPONGE) ×2 IMPLANT
GAUZE KITTNER 4X5 RF (MISCELLANEOUS) ×5 IMPLANT
GAUZE SPONGE 4X4 12PLY STRL (GAUZE/BANDAGES/DRESSINGS) ×2 IMPLANT
GLOVE SURG MICRO LTX SZ7.5 (GLOVE) ×4 IMPLANT
GLOVE SURG POLYISO LF SZ6.5 (GLOVE) ×1 IMPLANT
GLOVE SURG POLYISO LF SZ7 (GLOVE) ×1 IMPLANT
GOWN STRL REUS W/ TWL LRG LVL3 (GOWN DISPOSABLE) ×2 IMPLANT
GOWN STRL REUS W/ TWL XL LVL3 (GOWN DISPOSABLE) ×2 IMPLANT
GOWN STRL REUS W/TWL 2XL LVL3 (GOWN DISPOSABLE) ×2 IMPLANT
GOWN STRL REUS W/TWL LRG LVL3 (GOWN DISPOSABLE) ×6
GOWN STRL REUS W/TWL XL LVL3 (GOWN DISPOSABLE) ×4
HEMOSTAT SURGICEL 2X14 (HEMOSTASIS) ×6 IMPLANT
IRRIGATION STRYKERFLOW (MISCELLANEOUS) ×1 IMPLANT
IRRIGATOR STRYKERFLOW (MISCELLANEOUS) ×2
KIT BASIN OR (CUSTOM PROCEDURE TRAY) ×2 IMPLANT
KIT SUCTION CATH 14FR (SUCTIONS) IMPLANT
KIT TURNOVER KIT B (KITS) ×2 IMPLANT
NDL HYPO 25GX1X1/2 BEV (NEEDLE) ×1 IMPLANT
NDL SPNL 22GX3.5 QUINCKE BK (NEEDLE) ×1 IMPLANT
NEEDLE HYPO 25GX1X1/2 BEV (NEEDLE) ×2 IMPLANT
NEEDLE SPNL 22GX3.5 QUINCKE BK (NEEDLE) ×2 IMPLANT
NS IRRIG 1000ML POUR BTL (IV SOLUTION) ×2 IMPLANT
PACK CHEST (CUSTOM PROCEDURE TRAY) ×2 IMPLANT
PAD ARMBOARD 7.5X6 YLW CONV (MISCELLANEOUS) ×4 IMPLANT
PORT ACCESS TROCAR AIRSEAL 12 (TROCAR) ×1 IMPLANT
PORT ACCESS TROCAR AIRSEAL 5M (TROCAR) ×1
RELOAD STAPLE 45 2.5 WHT DVNC (STAPLE) IMPLANT
RELOAD STAPLE 45 3.5 BLU DVNC (STAPLE) IMPLANT
RELOAD STAPLE 45 4.3 GRN DVNC (STAPLE) IMPLANT
RELOAD STAPLE 45 4.6 BLK DVNC (STAPLE) IMPLANT
RELOAD STAPLER 2.5X45 WHT DVNC (STAPLE) ×4 IMPLANT
RELOAD STAPLER 3.5X45 BLU DVNC (STAPLE) ×5 IMPLANT
RELOAD STAPLER 4.3X45 GRN DVNC (STAPLE) ×2 IMPLANT
RELOAD STAPLER 45 4.6 BLK DVNC (STAPLE) ×5 IMPLANT
SCISSORS LAP 5X35 DISP (ENDOMECHANICALS) IMPLANT
SEAL CANN UNIV 5-8 DVNC XI (MISCELLANEOUS) ×2 IMPLANT
SEAL XI 5MM-8MM UNIVERSAL (MISCELLANEOUS) ×2
SEALANT PROGEL (MISCELLANEOUS) IMPLANT
SET TRI-LUMEN FLTR TB AIRSEAL (TUBING) ×2 IMPLANT
SOLUTION ELECTROLUBE (MISCELLANEOUS) ×2 IMPLANT
SPONGE INTESTINAL PEANUT (DISPOSABLE) IMPLANT
SPONGE T-LAP 18X18 ~~LOC~~+RFID (SPONGE) ×5 IMPLANT
SPONGE T-LAP 4X18 ~~LOC~~+RFID (SPONGE) ×1 IMPLANT
SPONGE TONSIL TAPE 1 RFD (DISPOSABLE) IMPLANT
STAPLER 45 DA VINCI SURE FORM (STAPLE) ×2
STAPLER 45 SUREFORM CVD (STAPLE) ×1
STAPLER 45 SUREFORM CVD DVNC (STAPLE) IMPLANT
STAPLER 45 SUREFORM DVNC (STAPLE) IMPLANT
STAPLER CANNULA SEAL DVNC XI (STAPLE) ×2 IMPLANT
STAPLER CANNULA SEAL XI (STAPLE) ×4
STAPLER RELOAD 2.5X45 WHITE (STAPLE) ×4
STAPLER RELOAD 2.5X45 WHT DVNC (STAPLE) ×4
STAPLER RELOAD 3.5X45 BLU DVNC (STAPLE) ×5
STAPLER RELOAD 3.5X45 BLUE (STAPLE) ×5
STAPLER RELOAD 4.3X45 GREEN (STAPLE) ×4
STAPLER RELOAD 4.3X45 GRN DVNC (STAPLE) ×2
STAPLER RELOAD 45 4.6 BLK (STAPLE) ×5
STAPLER RELOAD 45 4.6 BLK DVNC (STAPLE) ×5
SUT PDS AB 3-0 SH 27 (SUTURE) IMPLANT
SUT PROLENE 4 0 RB 1 (SUTURE)
SUT PROLENE 4-0 RB1 .5 CRCL 36 (SUTURE) IMPLANT
SUT SILK  1 MH (SUTURE) ×4
SUT SILK 1 MH (SUTURE) ×2 IMPLANT
SUT SILK 1 TIES 10X30 (SUTURE) IMPLANT
SUT SILK 2 0 SH (SUTURE) IMPLANT
SUT SILK 2 0SH CR/8 30 (SUTURE) IMPLANT
SUT SILK 3 0SH CR/8 30 (SUTURE) IMPLANT
SUT VIC AB 1 CTX 36 (SUTURE) ×2
SUT VIC AB 1 CTX36XBRD ANBCTR (SUTURE) IMPLANT
SUT VIC AB 2-0 CTX 36 (SUTURE) ×1 IMPLANT
SUT VIC AB 3-0 MH 27 (SUTURE) IMPLANT
SUT VIC AB 3-0 X1 27 (SUTURE) ×4 IMPLANT
SUT VICRYL 0 TIES 12 18 (SUTURE) ×2 IMPLANT
SUT VICRYL 0 UR6 27IN ABS (SUTURE) ×4 IMPLANT
SUT VICRYL 2 TP 1 (SUTURE) IMPLANT
SYR 20ML LL LF (SYRINGE) ×4 IMPLANT
SYSTEM RETRIEVAL ANCHOR 12 (MISCELLANEOUS) ×2 IMPLANT
SYSTEM SAHARA CHEST DRAIN ATS (WOUND CARE) ×2 IMPLANT
TAPE CLOTH 4X10 WHT NS (GAUZE/BANDAGES/DRESSINGS) ×2 IMPLANT
TAPE PAPER 3X10 WHT MICROPORE (GAUZE/BANDAGES/DRESSINGS) ×1 IMPLANT
TIP APPLICATOR SPRAY EXTEND 16 (VASCULAR PRODUCTS) IMPLANT
TOWEL GREEN STERILE (TOWEL DISPOSABLE) ×4 IMPLANT
TRAY FOLEY MTR SLVR 16FR STAT (SET/KITS/TRAYS/PACK) ×2 IMPLANT
TROCAR BLADELESS 15MM (ENDOMECHANICALS) IMPLANT
TROCAR XCEL 12X100 BLDLESS (ENDOMECHANICALS) IMPLANT
TROCAR XCEL BLADELESS 5X75MML (TROCAR) IMPLANT
WATER STERILE IRR 1000ML POUR (IV SOLUTION) ×2 IMPLANT

## 2021-08-10 NOTE — Interval H&P Note (Signed)
History and Physical Interval Note:  PFTs are good  08/10/2021 9:14 AM  Derek Richards  has presented today for surgery, with the diagnosis of LUL LUNG NODULE.  The various methods of treatment have been discussed with the patient and family. After consideration of risks, benefits and other options for treatment, the patient has consented to  Procedure(s): XI ROBOTIC ASSISTED THORASCOPY-WEDGE RESECTION, possible upper lobectomy (Left) as a surgical intervention.  The patient's history has been reviewed, patient examined, no change in status, stable for surgery.  I have reviewed the patient's chart and labs.  Questions were answered to the patient's satisfaction.     Melrose Nakayama

## 2021-08-10 NOTE — Brief Op Note (Signed)
08/10/2021  2:14 PM  PATIENT:  Derek Richards  81 y.o. 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 Margins LUL negative for malignancy  COUNTS CORRECT:  YES  DICTATION: .Dragon Dictation  PLAN OF CARE: Admit to inpatient   PATIENT DISPOSITION:  PACU - hemodynamically stable.   Delay start of Pharmacological VTE agent (>24hrs) due to surgical blood loss or risk of bleeding: no

## 2021-08-10 NOTE — Anesthesia Procedure Notes (Signed)
Procedure Name: Intubation Date/Time: 08/10/2021 10:17 AM Performed by: Moshe Salisbury, CRNA Pre-anesthesia Checklist: Patient identified, Emergency Drugs available, Suction available and Patient being monitored Patient Re-evaluated:Patient Re-evaluated prior to induction Oxygen Delivery Method: Circle System Utilized Preoxygenation: Pre-oxygenation with 100% oxygen Induction Type: IV induction Ventilation: Mask ventilation without difficulty Laryngoscope Size: Mac and 4 Grade View: Grade II Endobronchial tube: Left, Double lumen EBT, EBT position confirmed by auscultation and EBT position confirmed by fiberoptic bronchoscope and 37 Fr Number of attempts: 1 Airway Equipment and Method: Stylet and Fiberoptic brochoscope Placement Confirmation: ETT inserted through vocal cords under direct vision, positive ETCO2 and breath sounds checked- equal and bilateral Secured at: 28 cm Tube secured with: Tape Dental Injury: Teeth and Oropharynx as per pre-operative assessment

## 2021-08-10 NOTE — Hospital Course (Addendum)
HPI: This is an 81 year old man with a history of tobacco abuse, emphysema, hypertension, PAD with right carotid enterectomy and iliac stents.  He has smoked 1 to 1-1/2 packs of cigarettes daily since he was 81 years old (greater than 70 pack years).  He recently started [place] for an annual physical.  With his smoking history he was advised to have a low-dose CT for lung cancer screening.  That showed a cavitary nodule in the left upper lobe.  There was no mediastinal or hilar adenopathy.  There was some centrilobular emphysema there was also some aortic atherosclerosis.  There was a large bleb in the right lung medially.  A PET/CT showed the left upper lobe nodule was markedly hypermetabolic.  He has referred consideration for surgical resection.  He continues to smoke at least a pack a day.  He is retired but remains relatively active.  He plays golf frequently.  He is not having any chest pain, pressure, or tightness.  He does get short of breath with heavy exertion like working in the yard.  He feels like he can walk up 2 flights of stairs without stopping.  He does not get short of breath with ambulating at a regular pace.  He has an occasional cough.  Denies wheezing.  No change in appetite or weight loss.  No unusual headaches or visual changes.   The plan would be to do a robotic left VATS for wedge resection and then proceed with robotic left upper lobectomy if the frozen section shows cancer. Dr. Roxan Hockey discussed potential risks, benefits, and complications of the surgery. Patient agreed to proceed with surgery.  Hospital Course: He underwent a Xi robotic assisted left thoracoscopy, wedge LUL, LUL, lymph node dissection, and intercostal nerve block (ribs 3 through 10). He was extubated and transported from the OR to PACU in stable condition. He was extubated and transported in stable condition from the OR to PACU. Chest was to water seal. There was an air leak with cough. Chest x ray  showed small left apical pneumothorax. A line and foley were removed early in his post operative course. IVF were decreased and he was put on Lovenox for DVT prophylaxis. He was ambulating on room air. He was tolerating a diet. All wounds are clean, dry, and healing without signs of infection. Air leak resolved, chest x ray remained stable so chest tube was removed on 02/15. Follow up chest x ray showed trace left apical pneumothorax and unchanged subcutaneous emphysema left lateral chest wall. PA/LAT CXR on 02/16 showed stable trace left apical pneumothorax, subcutaneous emphysema left lateral chest wall, and mild atelectasis left lung base. Patient did have a fair amount of sero sanguinous drainage from chest tube wound. Silk suture intact but leaking proximal to suture. Patient given gauze and tape and instructed to change dressing daily and PRN. Once stops draining, likely in a couple days, he may let open to the air. He was instructed he does not need to put a dressing on other wounds. Patient is felt surgically stable for discharge today.

## 2021-08-10 NOTE — Progress Notes (Signed)
Notified Dr.  Glennon Mac of pt's BP 126/101 and Na+ 129 on pre op labs. No orders at this time. Dr. Glennon Mac will come see patient.

## 2021-08-10 NOTE — Anesthesia Procedure Notes (Signed)
Arterial Line Insertion Start/End2/13/2023 9:50 AM, 08/10/2021 10:00 AM Performed by: Moshe Salisbury, CRNA, CRNA  Patient location: Pre-op. Preanesthetic checklist: patient identified, IV checked, site marked, risks and benefits discussed, surgical consent, monitors and equipment checked, pre-op evaluation, timeout performed and anesthesia consent Lidocaine 1% used for infiltration Right, radial was placed Catheter size: 20 G Hand hygiene performed , maximum sterile barriers used  and Seldinger technique used  Attempts: 2 Procedure performed without using ultrasound guided technique. Following insertion, dressing applied and Biopatch. Patient tolerated the procedure well with no immediate complications.

## 2021-08-10 NOTE — Transfer of Care (Signed)
Immediate Anesthesia Transfer of Care Note  Patient: Derek Richards  Procedure(s) Performed: XI ROBOTIC ASSISTED THORASCOPY-WEDGE RESECTION, Left upper lobectomy (Left: Chest) INTERCOSTAL NERVE BLOCK (Left: Chest) NODE DISSECTION (Left: Chest)  Patient Location: PACU  Anesthesia Type:General  Level of Consciousness: drowsy and patient cooperative  Airway & Oxygen Therapy: Patient Spontanous Breathing and Patient connected to nasal cannula oxygen  Post-op Assessment: Report given to RN, Post -op Vital signs reviewed and stable and Patient moving all extremities  Post vital signs: Reviewed and stable  Last Vitals:  Vitals Value Taken Time  BP 139/76 08/10/21 1431  Temp    Pulse 103 08/10/21 1434  Resp 19 08/10/21 1434  SpO2 99 % 08/10/21 1434  Vitals shown include unvalidated device data.  Last Pain:  Vitals:   08/10/21 0854  TempSrc: Oral         Complications: No notable events documented.

## 2021-08-10 NOTE — Plan of Care (Signed)

## 2021-08-10 NOTE — Anesthesia Postprocedure Evaluation (Signed)
Anesthesia Post Note  Patient: RYEN RHAMES  Procedure(s) Performed: XI ROBOTIC ASSISTED THORASCOPY-WEDGE RESECTION, Left upper lobectomy (Left: Chest) INTERCOSTAL NERVE BLOCK (Left: Chest) NODE DISSECTION (Left: Chest)     Patient location during evaluation: PACU Anesthesia Type: General Level of consciousness: awake and alert Pain management: pain level controlled Vital Signs Assessment: post-procedure vital signs reviewed and stable Respiratory status: spontaneous breathing, nonlabored ventilation, respiratory function stable and patient connected to nasal cannula oxygen Cardiovascular status: blood pressure returned to baseline and stable Postop Assessment: no apparent nausea or vomiting Anesthetic complications: no   No notable events documented.  Last Vitals:  Vitals:   08/10/21 1515 08/10/21 1537  BP: 118/72 (!) 143/90  Pulse: 78 86  Resp: 16 16  Temp:  36.4 C  SpO2: 97% 94%    Last Pain:  Vitals:   08/10/21 1537  TempSrc: Oral                 Audry Pili

## 2021-08-11 ENCOUNTER — Encounter (HOSPITAL_COMMUNITY): Payer: Self-pay | Admitting: Thoracic Surgery (Cardiothoracic Vascular Surgery)

## 2021-08-11 ENCOUNTER — Inpatient Hospital Stay (HOSPITAL_COMMUNITY): Payer: Medicare Other

## 2021-08-11 LAB — BASIC METABOLIC PANEL
Anion gap: 10 (ref 5–15)
BUN: 14 mg/dL (ref 8–23)
CO2: 23 mmol/L (ref 22–32)
Calcium: 8.4 mg/dL — ABNORMAL LOW (ref 8.9–10.3)
Chloride: 98 mmol/L (ref 98–111)
Creatinine, Ser: 1.14 mg/dL (ref 0.61–1.24)
GFR, Estimated: >60 mL/min (ref >60)
Glucose, Bld: 138 mg/dL — ABNORMAL HIGH (ref 70–99)
Potassium: 4.6 mmol/L (ref 3.5–5.1)
Sodium: 131 mmol/L — ABNORMAL LOW (ref 135–145)

## 2021-08-11 LAB — CBC
HCT: 39.4 % (ref 39.0–52.0)
Hemoglobin: 13.4 g/dL (ref 13.0–17.0)
MCH: 31.8 pg (ref 26.0–34.0)
MCHC: 34 g/dL (ref 30.0–36.0)
MCV: 93.4 fL (ref 80.0–100.0)
Platelets: 208 10*3/uL (ref 150–400)
RBC: 4.22 MIL/uL (ref 4.22–5.81)
RDW: 12.6 % (ref 11.5–15.5)
WBC: 7.9 10*3/uL (ref 4.0–10.5)
nRBC: 0 % (ref 0.0–0.2)

## 2021-08-11 MED ORDER — KETOROLAC TROMETHAMINE 15 MG/ML IJ SOLN
15.0000 mg | Freq: Four times a day (QID) | INTRAMUSCULAR | Status: DC | PRN
  Administered 2021-08-12 (×2): 15 mg via INTRAVENOUS
  Filled 2021-08-11 (×2): qty 1

## 2021-08-11 NOTE — Progress Notes (Signed)
Mobility Specialist Criteria Algorithm Info.    08/11/21 1135  Mobility  Activity Ambulated independently in hallway (to recliner after)  Range of Motion/Exercises Active;All extremities  Level of Assistance Modified independent, requires aide device or extra time  Assistive Device Front wheel walker  Distance Ambulated (ft) 400 ft  Activity Response Tolerated well   Patient received in bed eager to participate. Ambulated in hallway mod I with steady gait. Tolerated ambulation well without need of supplemental oxygen, saturating 90-91% on RA. Returned to room without incident or complaint. Was left sitting in recliner chair with all needs met, call bell in reach.    08/11/2021 12:19 PM  [place] Cherica Heiden, [place], [place]  KQASU:[phone] Office: [phone]

## 2021-08-11 NOTE — Op Note (Signed)
NAME: Derek Richards, BRACEY MEDICAL RECORD NO: [id number] ACCOUNT NO: [id number] DATE OF BIRTH: [birth year] FACILITY: [facility] LOCATION: [facility] PHYSICIAN: Revonda Standard. Roxan Hockey, MD  Operative Report   DATE OF PROCEDURE: 08/10/2021  PREOPERATIVE DIAGNOSIS:  Left upper lobe lung nodule.  POSTOPERATIVE DIAGNOSIS:  Non-small cell carcinoma, clinical stage IB (T2, N0).  PROCEDURE:   Xi robotic-assisted left upper lobe wedge resection,  Left upper lobectomy,  Lymph node dissection,  Intercostal nerve blocks levels 3 through 10.  SURGEON:  Modesto Charon, MD  ASSISTANT:  Lars Pinks, PA-C.  ANESTHESIA:  General.  FINDINGS:  Adhesions of the lower lobe to the posterior chest wall and upper lobe to the lateral chest wall. Mass clearly visible in anterior apical left upper lobe.  Frozen section revealed non-small cell carcinoma. Enlarged but otherwise benign-appearing lymph nodes.  Bronchial margin negative for tumor.  CLINICAL NOTE:  Mr. Derek Richards is an 81 year old man with a history of tobacco abuse, who was found to have a lung nodule on a low dose CT for lung cancer screening.  The cavitary nodule was highly suspicious. The PET/CT showed the nodule was markedly  hypermetabolic, and he was referred for surgical resection.  The indications, risks, benefits, and alternatives were discussed in detail with the patient.  He understood and accepted the risks and agreed to proceed.  OPERATIVE NOTE:  Mr. Derek Richards was brought to the preoperative holding area on 08/10/2021.  Anesthesia placed an arterial blood pressure monitoring line and established intravenous access.  He was taken to the operating room and anesthetized and intubated  with a double lumen endotracheal tube.  Intravenous antibiotics were administered.  A Foley catheter was placed.  Sequential compression devices were placed on the calves for DVT prophylaxis.  He was placed in a right lateral decubitus position and the  left chest was  prepped and draped in the usual sterile fashion.  Single lung ventilation of the right lung was initiated and was tolerated well throughout the procedure.  A timeout was performed.  A solution containing 20 mL of liposomal bupivacaine, 30 mL of 0.5% bupivacaine and 50 mL of saline was prepared.  This was used for local at the incision sites as well as for the intercostal nerve blocks.  An incision was made  in the eighth interspace in the midaxillary line, and an 8 mm robotic port was inserted.  The thoracoscope was advanced into the chest. After confirming intrapleural placement, carbon dioxide was insufflated per protocol.  A 12 mm port was placed in the  eighth interspace anterior to the camera port and a 12 mm AirSeal port was placed in the tenth interspace centered between the two anterior ports.  Intercostal nerve blocks then were performed from the third to the tenth interspace by injecting 10 ml of the bupivacaine solution into a subpleural plane at each level.  There were adhesions  of the upper lobe to the lateral chest wall as well as some anteriorly and also adhesions of the lower lobe to the posterior chest wall.  These were relatively thin filmy adhesions.  The adhesions were taken down with bipolar cautery.  The upper lobe was inspected.  In the anterior apical segment of the upper lobe, the mass was clearly  apparent and there was some invagination of the visceral pleura concerning for visceral pleural invasion. A wedge resection was performed with multiple firings of the robotic stapler using green and black cartridges.  The specimen was placed into a 12 mm  endoscopic retrieval bag  and removed through the tenth interspace incision.  It was sent for frozen section.  The lower lobe was retracted superiorly.  The inferior ligament was divided with bipolar cautery.  All lymph nodes that were encountered during  the dissection were removed and sent as separate specimens for permanent pathology.   Level 9 node was removed.  The lower lobe then was retracted anteriorly.  The pleural reflection was divided at the hilum posteriorly.  Level 7 and 4L nodes were  removed.  Also, a level 12 node was removed from along the pulmonary artery at the takeoff of the posterior upper lobe PA branch.  Dissection then carried superiorly over the hilum and a large but otherwise benign-appearing level 5 lymph node was removed  from the aortopulmonary window.  By this point, the frozen section had returned, showing non-small cell carcinoma and the decision was made to proceed with lobectomy as discussed with the patient preoperatively.  The dissection of the fissure was  initiated.  The pulmonary artery was identified in approximately the mid portion of the fissure. Level 12 nodes were removed and the fissure was relatively complete except for a small area anteriorly and posteriorly and both of these areas were  separately stapled using the robotic stapler.  Level 10 and 11 nodes were removed.  The lingular and posterior segmental PA branches were both divided with the robotic stapler using a vascular cartridge. There was a relatively large node centrally between the upper lobe bronchus and the PA which was dissected out both anteriorly and posteriorly.  Again, this was an otherwise benign-appearing node other than being enlarged.  The superior pulmonary vein was encircled and divided with the robotic stapler and then once the peribronchial node  was removed, the anterior apical trunk was divided with a stapler as well.  The robotic stapler was then placed across the left upper lobe bronchus at its origin and closed.  A test inflation showed good aeration of the lower lobe.  Stapler then was fired, transecting the bronchus.  The sponges and vessel loop that had been  placed during the dissection were removed.  The robot was undocked.  The anterior eighth interspace incision was lengthened to approximately 4 cm.  The  upper lobe was placed into a 12 mm endoscopic retrieval bag and was removed through the anterior  incision.  It was sent for frozen section of the bronchial margin, which subsequently returned with no tumor seen.  The chest was copiously irrigated with warm saline.  A test inflation showed no air leakage from the bronchial stump or the fissure.  With inspection of the lower lobe, there was a small tear. As there were additional firings remaining on the robotic  stapler,  the robotic ports were replaced and the robot was redocked.  The tear in the lower lobe was stapled using the robotic stapler.  The robotic instruments were again removed.  After inspection for hemostasis at the port sites, a 28-French Blake drain was placed through the  original port incision and directed to the apex,  it was secured with #1 silk suture.  Dual lung ventilation was resumed.  The remaining incisions were closed in standard fashion. The chest tube was placed to a pleuravac on water seal.  All sponge, needle and instrument counts were correct at the end of the procedure.  He was placed back into a supine position. He was extubated in the operating room and taken to the PACU in good condition.  Experienced  assistance was necessary for this case due to the complexity and surgical standard of care.  Lars Pinks performed that role.  She assisted with incisions, port placement, docking the robot, instrument exchange, specimen retrieval,  suctioning and wound closure.   SHW D: 08/10/2021 4:51:38 pm T: 08/11/2021 12:58:00 am  JOB: [id number]/ [id number]

## 2021-08-11 NOTE — Progress Notes (Addendum)
° °   °  [address]       [place] [address]             [phone]       1 Day Post-Op Procedure(s) (LRB): XI ROBOTIC ASSISTED THORASCOPY-WEDGE RESECTION, Left upper lobectomy (Left) INTERCOSTAL NERVE BLOCK (Left) NODE DISSECTION (Left)  Subjective: Patient just finished breakfast. Patient denies pain this am.  Objective: Vital signs in last 24 hours: Temp:  [97.6 F (36.4 C)-98.5 F (36.9 C)] 98.5 F (36.9 C) (02/14 0400) Pulse Rate:  [71-104] 71 (02/14 0400) Cardiac Rhythm: Normal sinus rhythm (02/14 0353) Resp:  [9-19] 17 (02/14 0400) BP: (104-219)/(67-118) 131/75 (02/14 0400) SpO2:  [94 %-100 %] 95 % (02/14 0400) Arterial Line BP: (136)/(68) 136/68 (02/13 1445) Weight:  [90.9 kg] 90.9 kg (02/13 0854)     Intake/Output from previous day: 02/13 0701 - 02/14 0700 In: 3549.3 [I.V.:3449.3; IV Piggyback:100] Out: 1550 [Urine:1100; Blood:200; Chest Tube:250]   Physical Exam:  Cardiovascular: RRR Pulmonary: Clear to auscultation bilaterally;rub on left with chest tube in place Abdomen: Soft, non tender, bowel sounds present. Extremities: SCDs in place Wounds: Clean and dry.  No erythema or signs of infection. Chest Tube: to water seal, air leak with cough  Lab Results: CBC: Recent Labs    08/11/21 0306  WBC 7.9  HGB 13.4  HCT 39.4  PLT 208   BMET:  Recent Labs    08/11/21 0306  NA 131*  K 4.6  CL 98  CO2 23  GLUCOSE 138*  BUN 14  CREATININE 1.14  CALCIUM 8.4*    PT/INR: No results for input(s): LABPROT, INR in the last 72 hours. ABG:  INR: Will add last result for INR, ABG once components are confirmed Will add last 4 CBG results once components are confirmed  Assessment/Plan:  1. CV - SR. On Lisinopril 5 mg daily (as taken prior to surgery) 2.  Pulmonary - On 2 liters of oxygen via [place]. Wean as able. Chest tube this am to water seal, air leak. Chest tube with 250 cc since surgery. CXR this am appears to show trace left  apical pneumothorax and minor subcutaneous emphysema. Chest tube to remain for now. Check CXR in am. Await final pathology for staging. Encourage incentive spirometer. 3. Decrease IVF and remove foley 4. Ambulate tid  Sharalyn Ink [facility] 08/11/2021,7:23 AM [phone]   Looks great Agree with above SCD + enoxaparin for DVT prophylaxis  Revonda Standard. Roxan Hockey, MD [facility] [phone]

## 2021-08-11 NOTE — Progress Notes (Signed)
SATURATION QUALIFICATIONS: (This note is used to comply with regulatory documentation for home oxygen)  Patient Saturations on Room Air at Rest = 94%  Patient Saturations on Room Air while Ambulating = 90%  Patient Saturations on 0 Liters of oxygen while Ambulating = n/a%  Please briefly explain why patient needs home oxygen:

## 2021-08-11 NOTE — Discharge Instructions (Addendum)
Patient instructed to place dry 4x4s with tape. Change daily and/or as needed. Patient instructed drainage should decrease/stop in a couple of days (as chest tube track will close from the inside).  Robot-Assisted Thoracic Surgery, Care After The following information offers guidance on how to care for yourself after your procedure. Your health care provider may also give you more specific instructions. If you have problems or questions, contact your health care provider. What can I expect after the procedure? After the procedure, it is common to have: Some pain and aches in the area of your surgical incisions. Pain when breathing in (inhaling) and coughing. Tiredness (fatigue). Trouble sleeping. Constipation. Follow these instructions at home: Medicines Take over-the-counter and prescription medicines only as told by your health care provider. If you were prescribed an antibiotic medicine, take it as told by your health care provider. Do not stop taking the antibiotic even if you start to feel better. Talk with your health care provider about safe and effective ways to manage pain after your procedure. Pain management should fit your specific health needs. Take pain medicine before pain becomes severe. Relieving and controlling your pain will make breathing easier for you. Ask your health care provider if the medicine prescribed to you requires you to avoid driving or using machinery. Eating and drinking Follow instructions from your health care provider about eating or drinking restrictions. These will vary depending on what procedure you had. Your health care provider may recommend: A liquid diet or soft diet for the first few days. Meals that are smaller and more frequent. A diet of fruits, vegetables, whole grains, and low-fat proteins. Limiting foods that are high in fat and processed sugar, including fried or sweet foods. Incision care Follow instructions from your health care  provider about how to take care of your incisions. Make sure you: Wash your hands with soap and water for at least 20 seconds before and after you change your bandage (dressing). If soap and water are not available, use hand sanitizer. Change your dressing as told by your health care provider. Leave stitches (sutures), skin glue, or adhesive strips in place. These skin closures may need to stay in place for 2 weeks or longer. If adhesive strip edges start to loosen and curl up, you may trim the loose edges. Do not remove adhesive strips completely unless your health care provider tells you to do that. Check your incision area every day for signs of infection. Check for: Redness, swelling, or more pain. Fluid or blood. Warmth. Pus or a bad smell. Activity Return to your normal activities as told by your health care provider. Ask your health care provider what activities are safe for you. Ask your health care provider when it is safe for you to drive. Do not lift anything that is heavier than 10 lb (4.5 kg), or the limit that you are told, until your health care provider says that it is safe. Rest as told by your health care provider. Avoid sitting for a long time without moving. Get up to take short walks every 1-2 hours. This is important to improve blood flow and breathing. Ask for help if you feel weak or unsteady. Do exercises as told by your health care provider. Pneumonia prevention Do deep breathing exercises and cough regularly as directed. This helps clear mucus and opens your lungs. Doing this helps prevent lung infection (pneumonia). If you were given an incentive spirometer, use it as told. An incentive spirometer is a tool that  measures how well you are filling your lungs with each breath. Coughing may hurt less if you try to support your chest. This is called splinting. Try one of these when you cough: Hold a pillow against your chest. Place the palms of both hands on top of your  incision area. Do not use any products that contain nicotine or tobacco. These products include cigarettes, chewing tobacco, and vaping devices, such as e-cigarettes. If you need help quitting, ask your health care provider. Avoid secondhand smoke. General instructions If you have a drainage tube: Follow instructions from your health care provider about how to take care of it. Do not travel by airplane after your tube is removed until your health care provider tells you it is safe. You may need to take these actions to prevent or treat constipation: Drink enough fluid to keep your urine pale yellow. Take over-the-counter or prescription medicines. Eat foods that are high in fiber, such as beans, whole grains, and fresh fruits and vegetables. Limit foods that are high in fat and processed sugars, such as fried or sweet foods. Keep all follow-up visits. This is important. Contact a health care provider if: You have redness, swelling, or more pain around an incision. You have fluid or blood coming from an incision. An incision feels warm to the touch. You have pus or a bad smell coming from an incision. You have a fever. You cannot eat or drink without vomiting. Your pain medicine is not controlling your pain. Get help right away if: You have chest pain. Your heart is beating quickly. You have trouble breathing. You have trouble speaking. You are confused. You feel weak or dizzy, or you faint. These symptoms may represent a serious problem that is an emergency. Do not wait to see if the symptoms will go away. Get medical help right away. Call your local emergency services (911 in the [place] Do not drive yourself to the hospital. Summary Talk with your health care provider about safe and effective ways to manage pain after your procedure. Pain management should fit your specific health needs. Return to your normal activities as told by your health care provider. Ask your health care  provider what activities are safe for you. Do deep breathing exercises and cough regularly as directed. This helps to clear mucus and prevent pneumonia. If it hurts to cough, ease pain by holding a pillow against your chest or by placing the palms of both hands over your incisions. This information is not intended to replace advice given to you by your health care provider. Make sure you discuss any questions you have with your health care provider. Document Revised: 03/07/2020 Document Reviewed: 03/07/2020 Elsevier Patient Education  2022 [employer].

## 2021-08-11 NOTE — Discharge Summary (Addendum)
Physician Discharge Summary       [name]       [name]             [phone]    Patient ID: Derek Richards MRN: [id number] DOB/AGE: [birth year] 81 y.o.  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 History of tobacco abuse 5. History of left carotid stenosis, asymptomatic 6. History of right CEA 7. History of emphysema (HCC)  Consults: None  Procedure (s):  Xi robotic-assisted left upper lobe wedge resection,  Left upper lobectomy,  Lymph node dissection,  Intercostal nerve blocks levels 3 through 10 by Dr. Roxan Hockey on 08/10/2021.  Pathology:  FINAL MICROSCOPIC DIAGNOSIS:   A. LUNG, LEFT UPPER LOBE, WEDGE RESECTION:  -  Invasive squamous cell carcinoma, keratinizing, grade 3 (poorly  differentiated), 3.2 cm in greatest dimension, visceral pleural invasion  not identified.   B. LUNG, LEFT UPPER LOBE, LOBECTOMY:  -  All margins negative for invasive carcinoma.  -  Four anthracotic lymph nodes, all negative for malignancy (0/4).   C. LYMPH NODE, LEVEL 9, EXCISION:  -  Anthracotic lymph node tissue, negative for malignancy (0/1).   D. LYMPH NODE, LEVEL 7, EXCISION:  -  Anthracotic lymph node tissue, negative for malignancy ()/1).   E. LYMPH NODE, 4L, EXCISION:  -  Anthracotic lymph node tissue, negative for malignancy (0/1).   F. LYMPH NODE, LEVEL 12, EXCISION:  -  Anthracotic lymph node tissue, negative for malignancy (0/1).   G. LYMPH NODE, LEVEL 5, EXCISION:  -  Anthracotic lymph node tissue, negative for malignancy (0/1).   H. LYMPH NODE, LEVEL 12 #2, EXCISION:  -  Anthracotic lymph node tissue, negative for malignancy (0/1).    I. LYMPH NODE, LEVEL 12 #3, EXCISION:  -  Metastatic squamous cell carcinoma, lymph node metastasis (1/1).   J. LYMPH NODE, LEVEL 10, EXCISION:  -  Anthracotic lymph node tissue, negative for malignancy (0/1).   K. LYMPH NODE, LEVEL 10 #2, EXCISION:  -  Anthracotic lymph node tissue, negative for malignancy (0/1).   L. LYMPH NODE, LEVEL 11, EXCISION:  -  Anthracotic lymph node tissue, negative for malignancy (0/1).   M. LYMPH NODE, LEVEL 11 #2, EXCISION:  -  Anthracotic lymph node tissue, negative for malignancy (0/1).   N. LYMPH NODE, LEVEL 10 #3, EXCISION:  -  Anthracotic lymph node tissue, negative for malignancy (0/1).   O. LYMPH NODE, LEVEL 13, EXCISION:  -  Anthracotic lymph node tissue, negative for malignancy (0/1).   P. LYMPH NODE, LEVEL 12 #4, EXCISION:  -  Anthracotic lymph node tissue, negative for malignancy (0/1).   Q. LYMPH NODE, LEVEL 11 #3, EXCISION:  -  Anthracotic lymph node tissue, negative for malignancy (0/1).   R. LYMPH NODE, LEVEL 10 #4, EXCISION:  -  Anthracotic lymph node tissue, negative for malignancy (0/1).  TNM Code:pT2a, pN1  HPI: This is an 81 year old man with a history of tobacco abuse, emphysema, hypertension, PAD with right carotid enterectomy and iliac stents.  He has smoked 1 to 1-1/2 packs of cigarettes daily since he was 81 years old (greater than 70 pack years).  He recently started [place] for an annual physical.  With his smoking history he  was advised to have a low-dose CT for lung cancer screening.  That showed a cavitary nodule in the left upper lobe.  There was no mediastinal or hilar adenopathy.  There was some centrilobular emphysema there was also some aortic atherosclerosis.  There was a large bleb in the right lung medially.  A PET/CT showed the left upper lobe nodule was markedly hypermetabolic.  He has referred consideration for surgical resection.  He continues to smoke at least a pack a day.  He is retired but remains  relatively active.  He plays golf frequently.  He is not having any chest pain, pressure, or tightness.  He does get short of breath with heavy exertion like working in the yard.  He feels like he can walk up 2 flights of stairs without stopping.  He does not get short of breath with ambulating at a regular pace.  He has an occasional cough.  Denies wheezing.  No change in appetite or weight loss.  No unusual headaches or visual changes.   The plan would be to do a robotic left VATS for wedge resection and then proceed with robotic left upper lobectomy if the frozen section shows cancer. Dr. Roxan Hockey discussed potential risks, benefits, and complications of the surgery. Patient agreed to proceed with surgery.  Hospital Course: He underwent a Xi robotic assisted left thoracoscopy, wedge LUL, LUL, lymph node dissection, and intercostal nerve block (ribs 3 through 10). He was extubated and transported from the OR to PACU in stable condition. He was extubated and transported in stable condition from the OR to PACU. Chest was to water seal. There was an air leak with cough. Chest x ray showed small left apical pneumothorax. A line and foley were removed early in his post operative course. IVF were decreased and he was put on Lovenox for DVT prophylaxis. He was ambulating on room air. He was tolerating a diet. All wounds are clean, dry, and healing without signs of infection. Air leak resolved, chest x ray remained stable so chest tube was removed on 02/15. Follow up chest x ray showed trace left apical pneumothorax and unchanged subcutaneous emphysema left lateral chest wall. PA/LAT CXR on 02/16 showed stable trace left apical pneumothorax, subcutaneous emphysema left lateral chest wall, and mild atelectasis left lung base. Patient did have a fair amount of sero sanguinous drainage from chest tube wound. Silk suture intact but leaking proximal to suture. Patient given gauze and tape and instructed to change  dressing daily and PRN. Once stops draining, likely in a couple days, he may let open to the air. He was instructed he does not need to put a dressing on other wounds. Patient is felt surgically stable for discharge today.    Latest Vital Signs: Blood pressure 127/78, pulse 86, temperature 98.3 F (36.8 C), temperature source Oral, resp. rate 18, height 5\' 8"  (1.727 m), weight 90.9 kg, SpO2 96 %.  Physical Exam: Cardiovascular: RRR Pulmonary: Clear to auscultation bilaterally;diminished on left Abdomen: Soft, non tender, bowel sounds present. Extremities: No LE edema Wounds: Clean and dry.  No erythema or signs of infection. There is a lot of sero sanguinous drainage from left side of chest tube wound.   Discharge Condition:Stable and discharged to home.  Recent laboratory studies:  Lab Results  Component Value Date   WBC 9.4 08/12/2021   HGB 12.7 (L) 08/12/2021   HCT 37.7 (L) 08/12/2021   MCV 94.5 08/12/2021   PLT 182 08/12/2021   Lab Results  Component Value Date   NA 133 (L) 08/12/2021   K 4.1 08/12/2021   CL 100 08/12/2021   CO2 26 08/12/2021   CREATININE 0.96 08/12/2021   GLUCOSE 111 (H) 08/12/2021      Diagnostic Studies: DG Chest 2 View  Result Date: 08/13/2021 CLINICAL DATA:  Pneumothorax. EXAM: CHEST - 2 VIEW COMPARISON:  08/12/2021. FINDINGS: The heart size and mediastinal contours are within normal limits. There is a trace left pneumothorax, not significantly changed from the prior exam. Mild atelectasis is present at the left lung base. The right lung is clear. Subcutaneous emphysema is noted in the lower left chest wall which is unchanged from the previous exam. Surgical clips are noted in the cervical soft tissues on the right. No acute osseous abnormality. IMPRESSION: 1. Stable trace pneumothorax at the left lung apex. 2. Mild atelectasis at the left lung base. Electronically Signed   By: Brett Fairy M.D.   On: 08/13/2021 04:44   DG Chest 2 View  Result  Date: 08/08/2021 CLINICAL DATA:  Nodule of apex of left lung. EXAM: CHEST - 2 VIEW COMPARISON:  Chest CT 06/23/2021.  PET CT 07/06/2021 FINDINGS: Cavitary left upper lobe lesion measures 19 mm transverse dimension. There is less soft tissue density and more internal air when compared with prior exams. No other pulmonary nodules. Mild peribronchial thickening. Normal heart size. No confluent consolidation, pleural effusion, or pneumothorax. No acute osseous abnormalities are seen. IMPRESSION: Left upper lobe cavitary lesion measures 19 mm in transverse dimension, slightly less soft tissue density and more internal air when compared with prior exams. Electronically Signed   By: Keith Rake M.D.   On: 08/08/2021 12:46   DG CHEST PORT 1 VIEW  Result Date: 08/12/2021 CLINICAL DATA:  81 year old male postoperative day 2 status post left upper lobectomy for non-small cell carcinoma. Chest tube. EXAM: PORTABLE CHEST 1 VIEW COMPARISON:  Portable chest 08/11/2021 and earlier. FINDINGS: Portable AP semi upright view at 0629 hours. Left chest tube remains in place and courses to the apex. Left chest wall gas has increased, but the trace left pneumothorax now is less apparent. Lower lung volumes. But Allowing for portable technique the lungs are clear. Mediastinal contours are within normal limits. Visualized tracheal air column is within normal limits. No acute osseous abnormality identified. Negative visible bowel gas. IMPRESSION: 1. Stable left chest tube. Increased left chest wall gas but left pneumothorax is no longer apparent. 2. Low lung volumes but no new cardiopulmonary abnormality. Electronically Signed   By: Genevie Ann M.D.   On: 08/12/2021 08:27   DG Chest Port 1 View  Result Date: 08/11/2021 CLINICAL DATA:  Status post left upper lobectomy. EXAM: PORTABLE CHEST 1 VIEW COMPARISON:  Chest x-ray from yesterday. FINDINGS: Left chest tube remains in place with unchanged trace left apical pneumothorax. Clear  lungs. No pleural effusion. Normal heart size. Slightly more conspicuous subcutaneous emphysema in the lower left lateral chest wall. IMPRESSION: 1. Unchanged trace left apical pneumothorax. Electronically Signed   By: Titus Dubin M.D.   On: 08/11/2021 08:25   DG Chest Port 1 View  Result Date: 08/10/2021 CLINICAL DATA:  Pneumothorax. EXAM: PORTABLE CHEST 1 VIEW COMPARISON:  August 06, 2021. FINDINGS: The heart size and mediastinal contours are within normal limits. Right lung is clear. Left-sided chest tube is noted with tip in the left lung apex. Minimal left apical pneumothorax is noted. The visualized skeletal structures are unremarkable. IMPRESSION: Left-sided chest tube is now noted. Minimal left apical  pneumothorax is noted. Electronically Signed   By: Marijo Conception M.D.   On: 08/10/2021 15:06   DG Chest Port 1V same Day  Result Date: 08/12/2021 CLINICAL DATA:  Chest tube removal. EXAM: PORTABLE CHEST 1 VIEW COMPARISON:  Chest x-ray from same day at 0629 hours. FINDINGS: Interval removal of the left-sided chest tube. Trace left apical pneumothorax. No consolidation or pleural effusion. The heart size and mediastinal contours are within normal limits. Unchanged subcutaneous emphysema in the lower left lateral chest wall. IMPRESSION: 1. Interval removal of the left-sided chest tube with trace left apical pneumothorax. Electronically Signed   By: Titus Dubin M.D.   On: 08/12/2021 14:00      Discharge Medications: Allergies as of 08/13/2021       Reactions   Chlorhexidine         Medication List     TAKE these medications    aspirin 81 MG EC tablet Take 81 mg by mouth daily.   gabapentin 300 MG capsule Commonly known as: NEURONTIN Take 1 capsule (300 mg total) by mouth 2 (two) times daily.   Lidocaine 4 % Ptch Apply 1 patch topically daily as needed (pain).   lisinopril 5 MG tablet Commonly known as: ZESTRIL Take 5 mg by mouth daily.   Multi-Vitamin tablet Take 1  tablet by mouth daily.   naproxen sodium 220 MG tablet Commonly known as: ALEVE Take 220 mg by mouth daily as needed (pain).   pantoprazole 40 MG tablet Commonly known as: PROTONIX Take 40 mg by mouth daily.   traMADol 50 MG tablet Commonly known as: ULTRAM Take 1 tablet (50 mg total) by mouth every 6 (six) hours as needed for moderate pain (mild pain).        Follow Up Appointments:  Follow-up Information     Melrose Nakayama, MD. Go on 09/01/2021.   Specialty: Cardiothoracic Surgery Why: PA/LAT CXR to be taken (at [facility] which is in the same building as Dr. Leonarda Salon office, on the ground floor) on 03/07 at 8:45 AM;Appointment time is at 9:15 AM Contact information: [place] [address] [phone]                 Signed: Sharalyn Ink [facility] 08/13/2021, 9:31 AM

## 2021-08-11 NOTE — Plan of Care (Signed)
  Problem: Education: Goal: Knowledge of General Education information will improve Description Including pain rating scale, medication(s)/side effects and non-pharmacologic comfort measures Outcome: Progressing   Problem: Health Behavior/Discharge Planning: Goal: Ability to manage health-related needs will improve Outcome: Progressing   Problem: Clinical Measurements: Goal: Will remain free from infection Outcome: Progressing   Problem: Nutrition: Goal: Adequate nutrition will be maintained Outcome: Progressing   Problem: Coping: Goal: Level of anxiety will decrease Outcome: Progressing   

## 2021-08-12 ENCOUNTER — Inpatient Hospital Stay (HOSPITAL_COMMUNITY): Payer: Medicare Other

## 2021-08-12 LAB — CBC
HCT: 37.7 % — ABNORMAL LOW (ref 39.0–52.0)
Hemoglobin: 12.7 g/dL — ABNORMAL LOW (ref 13.0–17.0)
MCH: 31.8 pg (ref 26.0–34.0)
MCHC: 33.7 g/dL (ref 30.0–36.0)
MCV: 94.5 fL (ref 80.0–100.0)
Platelets: 182 10*3/uL (ref 150–400)
RBC: 3.99 MIL/uL — ABNORMAL LOW (ref 4.22–5.81)
RDW: 12.7 % (ref 11.5–15.5)
WBC: 9.4 10*3/uL (ref 4.0–10.5)
nRBC: 0 % (ref 0.0–0.2)

## 2021-08-12 LAB — COMPREHENSIVE METABOLIC PANEL
ALT: 15 U/L (ref 0–44)
AST: 29 U/L (ref 15–41)
Albumin: 3.1 g/dL — ABNORMAL LOW (ref 3.5–5.0)
Alkaline Phosphatase: 35 U/L — ABNORMAL LOW (ref 38–126)
Anion gap: 7 (ref 5–15)
BUN: 14 mg/dL (ref 8–23)
CO2: 26 mmol/L (ref 22–32)
Calcium: 8.4 mg/dL — ABNORMAL LOW (ref 8.9–10.3)
Chloride: 100 mmol/L (ref 98–111)
Creatinine, Ser: 0.96 mg/dL (ref 0.61–1.24)
GFR, Estimated: >60 mL/min (ref >60)
Glucose, Bld: 111 mg/dL — ABNORMAL HIGH (ref 70–99)
Potassium: 4.1 mmol/L (ref 3.5–5.1)
Sodium: 133 mmol/L — ABNORMAL LOW (ref 135–145)
Total Bilirubin: 0.7 mg/dL (ref 0.3–1.2)
Total Protein: 5.6 g/dL — ABNORMAL LOW (ref 6.5–8.1)

## 2021-08-12 MED ORDER — TRAMADOL HCL 50 MG PO TABS: 50.0000 mg | ORAL_TABLET | Freq: Four times a day (QID) | ORAL | Status: DC | PRN

## 2021-08-12 MED ORDER — GABAPENTIN 300 MG PO CAPS
300.0000 mg | ORAL_CAPSULE | Freq: Two times a day (BID) | ORAL | Status: DC
  Administered 2021-08-12 – 2021-08-13 (×3): 300 mg via ORAL
  Filled 2021-08-12 (×3): qty 1

## 2021-08-12 MED ORDER — OXYCODONE HCL 5 MG PO TABS: 5.0000 mg | ORAL_TABLET | ORAL | Status: DC | PRN

## 2021-08-12 NOTE — Progress Notes (Addendum)
° °   °  [address]       [place] [address]             [phone]       2 Days Post-Op Procedure(s) (LRB): XI ROBOTIC ASSISTED THORASCOPY-WEDGE RESECTION, Left upper lobectomy (Left) INTERCOSTAL NERVE BLOCK (Left) NODE DISSECTION (Left)  Subjective: Patient just finished breakfast, which he said is not good. He has pain left anterior ribs and chest tube is draining "pretty good".  Objective: Vital signs in last 24 hours: Temp:  [98.1 F (36.7 C)-98.6 F (37 C)] 98.3 F (36.8 C) (02/15 0312) Pulse Rate:  [69-90] 69 (02/15 0312) Cardiac Rhythm: Normal sinus rhythm (02/15 0450) Resp:  [17-20] 20 (02/15 0312) BP: (114-155)/(66-88) 114/66 (02/15 0312) SpO2:  [96 %-97 %] 96 % (02/15 0312)     Intake/Output from previous day: 02/14 0701 - 02/15 0700 In: 1080 [P.O.:1080] Out: 2250 [Urine:2000; Chest Tube:250]   Physical Exam:  Cardiovascular: RRR Pulmonary: Clear to auscultation bilaterally;diminished on left Abdomen: Soft, non tender, bowel sounds present. Extremities: No LE edema Wounds: Clean and dry.  No erythema or signs of infection. There is sero sanguinous drainage from lower portion of chest tube wound. Dressing changed this am. Chest Tube: to water seal, tidling but no air leak with cough  Lab Results: CBC: Recent Labs    08/11/21 0306 08/12/21 0107  WBC 7.9 9.4  HGB 13.4 12.7*  HCT 39.4 37.7*  PLT 208 182    BMET:  Recent Labs    08/11/21 0306 08/12/21 0107  NA 131* 133*  K 4.6 4.1  CL 98 100  CO2 23 26  GLUCOSE 138* 111*  BUN 14 14  CREATININE 1.14 0.96  CALCIUM 8.4* 8.4*     PT/INR: No results for input(s): LABPROT, INR in the last 72 hours. ABG:  INR: Will add last result for INR, ABG once components are confirmed Will add last 4 CBG results once components are confirmed  Assessment/Plan:  1. CV - SR with HR in the 90's. On Lisinopril 5 mg daily (as taken prior to surgery)  2.  Pulmonary - On 2 liters of oxygen  via [place]. Wean as able. He does not desat with ambulation and will not need oxygen at discharge. Chest tube this am to water seal,, tidling with cough but no air leak. Chest tube with 250 cc last 24 hours. CXR this am appears to show stable, trace left apical pneumothorax and subcutaneous emphysema left lateral chest wall. Hope to remove chest tube. Check CXR in am. Await final pathology for staging. Encourage incentive spirometer. 3. On Lovenox for DVT prophylaxis 4. Mild hyponatremia-sodium up to 133 5. Mild expected post op blood loss anemia-H and H this am slightly decreased to 12.7 and 37.7 6. Regarding pain control, continue scheduled Toradol, Tylenol and will add Oxy Q 4 hours PRN severe pain  Donielle M ZimmermanPA-C 08/12/2021,7:01 AM [phone]   Patient seen and examined, agree with above No air leak- dc chest tube Possibly home tomorrow  Remo Lipps C. Roxan Hockey, MD [facility] [phone]

## 2021-08-12 NOTE — Plan of Care (Signed)
°  Problem: Education: Goal: Knowledge of General Education information will improve Description: Including pain rating scale, medication(s)/side effects and non-pharmacologic comfort measures Outcome: Progressing   Problem: Health Behavior/Discharge Planning: Goal: Ability to manage health-related needs will improve Outcome: Progressing   Problem: Clinical Measurements: Goal: Ability to maintain clinical measurements within normal limits will improve Outcome: Progressing Goal: Will remain free from infection Outcome: Progressing   Problem: Nutrition: Goal: Adequate nutrition will be maintained Outcome: Progressing   Problem: Coping: Goal: Level of anxiety will decrease Outcome: Progressing   Problem: Safety: Goal: Ability to remain free from injury will improve Outcome: Progressing

## 2021-08-12 NOTE — Progress Notes (Signed)
Mobility Specialist Criteria Algorithm Info.    08/12/21 1000  Mobility  Activity Ambulated with assistance in hallway  Range of Motion/Exercises Active;All extremities  Level of Assistance Modified independent, requires aide device or extra time  Assistive Device Front wheel walker  Distance Ambulated (ft) 400 ft  Activity Response Tolerated well  Transport method Ambulatory   Patient ambulated in hallway mod I with steady gait. Oxygen saturated 88-91% throughout ambulation but recovered quickly with rest. Tolerated without complaint or incident. Was left lying supine in bed with all needs met, call bell in reach.  08/12/2021 10:00 AM  Derek Richards, [place], [place]  TMYTR:[phone] Office: [phone]

## 2021-08-12 NOTE — Progress Notes (Signed)
RN has changed pt's dressing twice since removing chest tube earlier today. Dressing became wet with serosanguinous drainage.

## 2021-08-12 NOTE — TOC Initial Note (Signed)
Transition of Care [facility]) - Initial/Assessment Note    Patient Details  Name: Derek Richards MRN: [id number] Date of Birth: 1940/07/18  Transition of [facility]) CM/SW Contact:    Angelita Ingles, RN Phone Number:[phone]  08/12/2021, 9:34 AM  Clinical Narrative:                  Transition of Care [facility]) Screening Note   Patient Details  Name: Derek Richards Date of Birth: December 26, 1940   Transition of Care [facility]) CM/SW Contact:    Angelita Ingles, RN Phone Number:[phone]  08/12/2021, 9:35 AM    Transition of Care Department [facility]) has reviewed patient and no TOC needs have been identified at this time. We will continue to monitor patient advancement through interdisciplinary progression rounds. If new patient transition needs arise, please place a TOC consult.          Patient Goals and CMS Choice        Expected Discharge Plan and Services                                                Prior Living Arrangements/Services                       Activities of Daily Living      Permission Sought/Granted                  Emotional Assessment              Admission diagnosis:  Left upper lobe pulmonary nodule [R91.1] Patient Active Problem List   Diagnosis Date Noted   Left upper lobe pulmonary nodule 08/10/2021   Nodule of apex of left lung 07/22/2021   PAD (peripheral artery disease) ([place]) 07/22/2021   Hypertension 07/22/2021   Tobacco abuse 07/22/2021   Carotid stenosis, asymptomatic, left 07/22/2021   History of right-sided carotid endarterectomy 07/22/2021   Emphysema lung ([place]) 07/22/2021   PCP:  [name], NP Pharmacy:   [id number] - [place], [place] - [address] [place] [address] Phone: [phone] Fax: [phone]  [facility] [address] [place] [address] Phone: [phone] Fax: [phone]     Social Determinants of  Health (SDOH) Interventions    Readmission Risk Interventions No flowsheet data found.

## 2021-08-13 ENCOUNTER — Inpatient Hospital Stay (HOSPITAL_COMMUNITY): Payer: Medicare Other

## 2021-08-13 ENCOUNTER — Other Ambulatory Visit (HOSPITAL_COMMUNITY): Payer: Self-pay

## 2021-08-13 DIAGNOSIS — C3492 Malignant neoplasm of unspecified part of left bronchus or lung: Secondary | ICD-10-CM

## 2021-08-13 HISTORY — DX: Malignant neoplasm of unspecified part of left bronchus or lung: C34.92

## 2021-08-13 LAB — SURGICAL PATHOLOGY

## 2021-08-13 MED ORDER — TRAMADOL HCL 50 MG PO TABS
50.0000 mg | ORAL_TABLET | Freq: Four times a day (QID) | ORAL | 0 refills | Status: DC | PRN
  Filled 2021-08-13: qty 30, 7d supply, fill #0

## 2021-08-13 MED ORDER — GABAPENTIN 300 MG PO CAPS
300.0000 mg | ORAL_CAPSULE | Freq: Two times a day (BID) | ORAL | 0 refills | Status: DC
  Filled 2021-08-13: qty 60, 30d supply, fill #0

## 2021-08-13 NOTE — Care Management Important Message (Signed)
Important Message  Patient Details  Name: Derek Richards MRN: [id number] Date of Birth: 03-Feb-1941   Medicare Important Message Given:  Yes     Orbie Pyo 08/13/2021, 3:33 PM

## 2021-08-13 NOTE — Care Management Important Message (Signed)
Important Message  Patient Details  Name: Derek Richards MRN: [id number] Date of Birth: 02/24/1941   Medicare Important Message Given:  Yes     Tola Meas 08/13/2021, 1:10 PM

## 2021-08-13 NOTE — Progress Notes (Signed)
Walking 02 eval  Room air 92% Standing 92% Walking 90%

## 2021-08-13 NOTE — Progress Notes (Addendum)
° °   °  [address]       [place] [address]             [phone]       3 Days Post-Op Procedure(s) (LRB): XI ROBOTIC ASSISTED THORASCOPY-WEDGE RESECTION, Left upper lobectomy (Left) INTERCOSTAL NERVE BLOCK (Left) NODE DISSECTION (Left)  Subjective: Patient states pain better controlled. He still has a lot of draining from chest tube wound.  Objective: Vital signs in last 24 hours: Temp:  [97.6 F (36.4 C)-98.6 F (37 C)] 98.3 F (36.8 C) (02/16 0323) Pulse Rate:  [69-83] 69 (02/16 0323) Cardiac Rhythm: Normal sinus rhythm;Bundle branch block (02/15 1923) Resp:  [17-20] 19 (02/16 0323) BP: (112-148)/(66-84) 138/78 (02/16 0323) SpO2:  [80 %-96 %] 96 % (02/16 0323)     Intake/Output from previous day: 02/15 0701 - 02/16 0700 In: -  Out: 540 [Urine:500; Chest Tube:40]   Physical Exam:  Cardiovascular: RRR Pulmonary: Clear to auscultation bilaterally;diminished on left Abdomen: Soft, non tender, bowel sounds present. Extremities: No LE edema Wounds: Clean and dry.  No erythema or signs of infection. There is a lot of sero sanguinous drainage from left side of chest tube wound.   Lab Results: CBC: Recent Labs    08/11/21 0306 08/12/21 0107  WBC 7.9 9.4  HGB 13.4 12.7*  HCT 39.4 37.7*  PLT 208 182    BMET:  Recent Labs    08/11/21 0306 08/12/21 0107  NA 131* 133*  K 4.6 4.1  CL 98 100  CO2 23 26  GLUCOSE 138* 111*  BUN 14 14  CREATININE 1.14 0.96  CALCIUM 8.4* 8.4*     PT/INR: No results for input(s): LABPROT, INR in the last 72 hours. ABG:  INR: Will add last result for INR, ABG once components are confirmed Will add last 4 CBG results once components are confirmed  Assessment/Plan:  1. CV - SR with HR in the 90's. On Lisinopril 5 mg daily (as taken prior to surgery)  2.  Pulmonary - On room air. Chest tube removed yesterday. CXR this am appears to show stable, trace left apical pneumothorax and subcutaneous emphysema  left lateral chest wall as well as mild atelectasis left base. Encourage incentive spirometer. Final pathology: pT2a, pN1. Dr. Roxan Hockey to discuss with patient. 3. On Lovenox for DVT prophylaxis 4. Mild expected post op blood loss anemia-H and H this am slightly decreased to 12.7 and 37.7 6. Regarding pain control,on scheduled Toradol, Tylenol and Ultram PRN. Gabapentin added yesterday, which has helped 7. Chest tube wound with a lot of sero sanguinous drainage. Will discuss with Dr. Roxan Hockey-? Place suture. 8. Likely discharge later this am  Sharalyn Ink [facility] 08/13/2021,6:59 AM [phone]   Patient seen and examined, agree with above Change dressing as needed- drainage will stop in a day or 2 Home later today  Remo Lipps C. Roxan Hockey, MD [facility] [phone]

## 2021-08-17 ENCOUNTER — Telehealth: Payer: Self-pay | Admitting: Internal Medicine

## 2021-08-17 NOTE — Telephone Encounter (Signed)
Scheduled appt per 2/11 referral. Pt is aware of appt date and time. Pt is aware to arrive 15 mins prior to appt time and to bring and updated insurance card. Pt is aware of appt location.

## 2021-09-01 ENCOUNTER — Encounter: Payer: Self-pay | Admitting: Thoracic Surgery (Cardiothoracic Vascular Surgery)

## 2021-09-01 ENCOUNTER — Other Ambulatory Visit: Payer: Self-pay

## 2021-09-01 ENCOUNTER — Other Ambulatory Visit: Payer: Self-pay | Admitting: Thoracic Surgery (Cardiothoracic Vascular Surgery)

## 2021-09-01 ENCOUNTER — Ambulatory Visit (INDEPENDENT_AMBULATORY_CARE_PROVIDER_SITE_OTHER): Payer: Self-pay | Admitting: Thoracic Surgery (Cardiothoracic Vascular Surgery)

## 2021-09-01 ENCOUNTER — Ambulatory Visit
Admission: RE | Admit: 2021-09-01 | Discharge: 2021-09-01 | Disposition: A | Discharge status: Home / Self Care | Payer: Medicare Other | Source: Ambulatory Visit | Attending: Thoracic Surgery (Cardiothoracic Vascular Surgery) | Admitting: Thoracic Surgery (Cardiothoracic Vascular Surgery)

## 2021-09-01 DIAGNOSIS — Z9889 Other specified postprocedural states: Secondary | ICD-10-CM

## 2021-09-01 DIAGNOSIS — R911 Solitary pulmonary nodule: Secondary | ICD-10-CM

## 2021-09-01 MED ORDER — GABAPENTIN 300 MG PO CAPS: 300.0000 mg | ORAL_CAPSULE | Freq: Every day | ORAL | 0 refills | Status: DC

## 2021-09-01 NOTE — Progress Notes (Signed)
? ?   ?[address] [place] ?      [place] [address] ?            [phone]   ? ? ?HPI: Mr. Derek Richards returns for scheduled follow-up after a robotic left upper lobectomy Derek Richards is an 81 year old male with a history of tobacco abuse, COPD, hypertension, and PAD.  He has a greater than 70-pack-year history of smoking.  He saw a [place] and had a low-dose CT for lung cancer screening.  He was found to have a cavitary nodule in the left upper lobe.  PET/CT showed the nodule was hypermetabolic. ? ?He underwent a robotic left upper lobectomy and node dissection on 08/10/2021.  His postoperative course was unremarkable and he went home on day 3.  Final pathology showed a T2, N1, stage IIb squamous cell carcinoma. ? ?He feels well.  He is anxious to increase his activities.  He particularly wants to start playing golf again.  He is also anxious to begin driving.  He would like to stop the gabapentin.  He is not using tramadol. ? ?Past Medical History:  ?Diagnosis Date  ? Cancer [facility])   ? Hypertension   ? ? ?Current Outpatient Medications  ?Medication Sig Dispense Refill  ? acetaminophen (TYLENOL) 500 MG tablet Take 500 mg by mouth every 6 (six) hours as needed.    ? aspirin 81 MG EC tablet Take 81 mg by mouth daily.    ? Lidocaine 4 % PTCH Apply 1 patch topically daily as needed (pain).    ? lisinopril (ZESTRIL) 5 MG tablet Take 5 mg by mouth daily.    ? Multiple Vitamin (MULTI-VITAMIN) tablet Take 1 tablet by mouth daily.    ? pantoprazole (PROTONIX) 40 MG tablet Take 40 mg by mouth daily.    ? traMADol (ULTRAM) 50 MG tablet Take 1 tablet (50 mg total) by mouth every 6 (six) hours as needed for moderate pain (mild pain). 30 tablet 0  ? gabapentin (NEURONTIN) 300 MG capsule Take 1 capsule (300 mg total) by mouth at bedtime. 60 capsule 0  ? ?No current facility-administered medications for this visit.  ? ? ?Physical Exam ?BP 105/68 (BP Location: Left Arm, Patient Position: Sitting, Cuff Size: Normal)    Pulse 83   Resp 20   Wt 191 lb 6.4 oz (86.8 kg)   SpO2 96%   BMI 29.72 kg/m?  ?81 year old man in no acute distress ?Alert and oriented x3 with no focal deficits ?Lungs diminished at left base but otherwise clear ?Incisions healing well with minimal seroma at 10th interspace incision, no erythema or drainage. ? ?Diagnostic Tests: ?I personally reviewed his chest x-ray.  Shows postoperative changes.  No concerning findings. ? ?Impression: ?Derek Richards is an 81 year old man with history of tobacco abuse and COPD who was found to have a left upper lobe lung nodule on a low-dose CT for lung cancer screening.  The nodule is hypermetabolic on PET/CT.  He underwent a robotic left upper lobectomy and node dissection on 08/10/2021.  He did well postoperatively and went home on day 3.  Final pathology showed a T2, M1, stage IIb squamous cell carcinoma. ? ?From a surgical perspective he is doing extremely well.  He is having minimal pain.  He is not using narcotics.  He wants to stop the gabapentin.  I told him to drop it down to 1 pill at night for a week.  If he is okay pain wise at that point  he can stop it altogether. ? ?He may begin driving on a limited basis. ? ?Precautions were discussed.  I recommended he wait at least another 2 weeks before trying to play golf or riding a golf cart. ? ?Has not smoked since surgery.  Emphasized the importance of complete smoking cessation. ? ?He has stage IIb disease.  He would benefit from adjuvant chemotherapy.  He says that he does not want to do chemotherapy but is willing to talk to oncology.  He would prefer to see oncology in [place].  We will go ahead and arrange an appointment at the cancer center down there and cancel his appointment with Dr. Julien Nordmann here. ? ?Plan: ?Decrease gabapentin to 300 mg nightly for a week.  If no issues with pain can then stop altogether ?Prefers to see oncology at [facility].  Will cancel appointment with Dr. Julien Nordmann and refer to the [facility]  cancer center. ?Return in 6 weeks with PA and lateral chest x-ray ? ?Derek Nakayama, MD ?[facility] ?[phone] ? ? ? ? ?

## 2021-09-02 ENCOUNTER — Encounter: Payer: Self-pay | Admitting: *Deleted

## 2021-09-02 NOTE — Progress Notes (Signed)
Oncology Nurse Navigator Documentation ? ?Oncology Nurse Navigator Flowsheets 09/02/2021  ?Navigator Follow Up Date: 09/07/2021  ?Navigator Follow Up Reason: New Patient Appointment  ?Navigator Location [facility]  ?Navigator Encounter Type Telephone  ?Telephone Outgoing Call  ?Barriers/Navigation Needs Coordination of Care/I received a message from [facility] office that patient would like to see Dr. Julien Nordmann.  I called and spoke to wife. I got him re-scheduled to see Dr.  Julien Nordmann and notified new patient coordinator to cancel appt in [place] per patient's request.   ?Interventions Coordination of Care  ?Acuity Level 2-Minimal Needs (1-2 Barriers Identified)  ?Coordination of Care Appts  ?Time Spent with Patient 30  ?  ?

## 2021-09-03 ENCOUNTER — Other Ambulatory Visit: Payer: Self-pay

## 2021-09-03 DIAGNOSIS — C3492 Malignant neoplasm of unspecified part of left bronchus or lung: Secondary | ICD-10-CM

## 2021-09-07 ENCOUNTER — Telehealth: Payer: Self-pay | Admitting: Oncology

## 2021-09-07 ENCOUNTER — Inpatient Hospital Stay: Payer: Medicare Other

## 2021-09-07 ENCOUNTER — Telehealth: Payer: Self-pay | Admitting: Medical Oncology

## 2021-09-07 ENCOUNTER — Inpatient Hospital Stay: Payer: Medicare Other | Admitting: Internal Medicine

## 2021-09-07 ENCOUNTER — Other Ambulatory Visit: Payer: Self-pay

## 2021-09-07 ENCOUNTER — Inpatient Hospital Stay: Payer: Medicare Other | Attending: Internal Medicine | Admitting: Internal Medicine

## 2021-09-07 VITALS — BP 130/89 | HR 106 | Temp 97.2°F | Resp 17 | Wt 192.0 lb

## 2021-09-07 DIAGNOSIS — C3492 Malignant neoplasm of unspecified part of left bronchus or lung: Secondary | ICD-10-CM

## 2021-09-07 DIAGNOSIS — C3412 Malignant neoplasm of upper lobe, left bronchus or lung: Secondary | ICD-10-CM | POA: Insufficient documentation

## 2021-09-07 DIAGNOSIS — I10 Essential (primary) hypertension: Secondary | ICD-10-CM | POA: Insufficient documentation

## 2021-09-07 DIAGNOSIS — Z5111 Encounter for antineoplastic chemotherapy: Secondary | ICD-10-CM | POA: Insufficient documentation

## 2021-09-07 HISTORY — DX: Encounter for antineoplastic chemotherapy: Z51.11

## 2021-09-07 LAB — CBC WITH DIFFERENTIAL (CANCER CENTER ONLY)
Abs Immature Granulocytes: 0.02 10*3/uL (ref 0.00–0.07)
Basophils Absolute: 0 10*3/uL (ref 0.0–0.1)
Basophils Relative: 0 %
Eosinophils Absolute: 0.2 10*3/uL (ref 0.0–0.5)
Eosinophils Relative: 3 %
HCT: 41.5 % (ref 39.0–52.0)
Hemoglobin: 14.2 g/dL (ref 13.0–17.0)
Immature Granulocytes: 0 %
Lymphocytes Relative: 36 %
Lymphs Abs: 2.6 10*3/uL (ref 0.7–4.0)
MCH: 31 pg (ref 26.0–34.0)
MCHC: 34.2 g/dL (ref 30.0–36.0)
MCV: 90.6 fL (ref 80.0–100.0)
Monocytes Absolute: 0.7 10*3/uL (ref 0.1–1.0)
Monocytes Relative: 10 %
Neutro Abs: 3.7 10*3/uL (ref 1.7–7.7)
Neutrophils Relative %: 51 %
Platelet Count: 320 10*3/uL (ref 150–400)
RBC: 4.58 MIL/uL (ref 4.22–5.81)
RDW: 11.8 % (ref 11.5–15.5)
WBC Count: 7.2 10*3/uL (ref 4.0–10.5)
nRBC: 0 % (ref 0.0–0.2)

## 2021-09-07 LAB — CMP (CANCER CENTER ONLY)
ALT: 22 U/L (ref 10–47)
AST: 17 U/L (ref 11–38)
Albumin: 4.2 g/dL (ref 3.5–5.0)
Alkaline Phosphatase: 75 U/L (ref 38–126)
Anion gap: 6 (ref 5–15)
BUN: 12 mg/dL (ref 8–23)
CO2: 31 mmol/L (ref 22–32)
Calcium: 9.6 mg/dL (ref 8.9–10.3)
Chloride: 93 mmol/L — ABNORMAL LOW (ref 98–111)
Creatinine: 0.98 mg/dL (ref 0.60–1.20)
GFR, Estimated: >60 mL/min (ref >60)
Glucose, Bld: 94 mg/dL (ref 70–99)
Potassium: 4.5 mmol/L (ref 3.5–5.1)
Sodium: 130 mmol/L — ABNORMAL LOW (ref 135–145)
Total Bilirubin: 0.5 mg/dL (ref 0.2–1.6)
Total Protein: 7 g/dL (ref 6.5–8.1)
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
## 2021-09-07 NOTE — Telephone Encounter (Signed)
Pt referred to [facility] per his request. [facility] received referral information. ?

## 2021-09-07 NOTE — Telephone Encounter (Signed)
I received a vm from Diane over at Dr.Mohamed's office [phone]] requesting we schedule the patient at our [facility] location. ? ?Contacted the patient via phone, lvm for them to contact me back so that I may go ahead and schedule him at our location.  ?

## 2021-09-08 ENCOUNTER — Encounter: Payer: Self-pay | Admitting: *Deleted

## 2021-09-08 NOTE — Progress Notes (Signed)
Oncology Nurse Navigator Documentation ? ?Oncology Nurse Navigator Flowsheets 09/08/2021 09/02/2021  ?Abnormal Finding Date 06/23/2021 -  ?Confirmed Diagnosis Date 08/10/2021 -  ?Diagnosis Status Confirmed Diagnosis Complete -  ?Planned Course of Treatment Chemotherapy -  ?Phase of Treatment Chemo -  ?Navigator Follow Up Date: - 09/07/2021  ?Navigator Follow Up Reason: - New Patient Appointment  ?Navigation Complete Date: 09/08/2021 -  ?Post Navigation: Continue to Follow Patient? No -  ?Reason Not Navigating Patient: Seeking Care elsewhere -  ?Navigator Location [facility]  ?Navigator Encounter Type Appt/Treatment Plan Review Telephone  ?Telephone - Outgoing Call  ?Patient Visit Type Other -  ?Treatment Phase Pre-Tx/Tx Discussion -  ?Barriers/Navigation Needs Coordination of Care/I followed up on Derek Richards's plan of care per Dr. Julien Nordmann. Patient has been referred to [facility] cancer center due to him living close to them. I called [facility] to make sure they received the referral and asked if they needed anything from [place]. They do not need anything at this time and they have received referred.  Coordination of Care  ?Interventions Coordination of Care Coordination of Care  ?Acuity Level 2-Minimal Needs (1-2 Barriers Identified) Level 2-Minimal Needs (1-2 Barriers Identified)  ?Coordination of Care - Appts  ?Time Spent with Patient 30 30  ?  ?

## 2021-09-11 ENCOUNTER — Ambulatory Visit: Payer: Medicare Other | Admitting: Oncology

## 2021-09-11 ENCOUNTER — Other Ambulatory Visit: Payer: Medicare Other

## 2021-09-15 ENCOUNTER — Telehealth: Payer: Self-pay | Admitting: Oncology

## 2021-09-15 ENCOUNTER — Telehealth: Payer: Self-pay

## 2021-09-15 NOTE — Telephone Encounter (Signed)
Patient's wife, Letta Median contacted the office stating she was unsure if patient was to continue to take Gabapentin or to stop it. She states that he is getting ready to start chemo and did not know if he should still be taking it. Advised that per Dr. Leonarda Salon last note, patient was to take 300 mg PO daily for one week and if he was having no pain he could stop the medication. Advised this again to patient's wife. She acknowledged receipt. ?

## 2021-09-15 NOTE — Telephone Encounter (Signed)
Contacted pt to see if it was fine to add RadOnc appt consult for the same day they saw Dr. Bobby Rumpf.  ? ?I spoke with the wife and at this time thinks it is best to not schedule w/ Dr.Palermo. Pt is not sure what he wants to do so they wish to speak with the Oncologist before scheduling anything else at this time. ?
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
## 2021-09-17 ENCOUNTER — Encounter: Payer: Self-pay | Admitting: Oncology

## 2021-09-17 ENCOUNTER — Inpatient Hospital Stay (INDEPENDENT_AMBULATORY_CARE_PROVIDER_SITE_OTHER): Payer: Medicare Other | Admitting: Oncology

## 2021-09-17 ENCOUNTER — Other Ambulatory Visit: Payer: Self-pay | Admitting: Oncology

## 2021-09-17 ENCOUNTER — Inpatient Hospital Stay: Payer: Medicare Other

## 2021-09-17 ENCOUNTER — Other Ambulatory Visit: Payer: Self-pay

## 2021-09-17 VITALS — BP 137/81 | HR 98 | Temp 98.6°F | Resp 16 | Ht 68.0 in | Wt 192.2 lb

## 2021-09-17 DIAGNOSIS — C3492 Malignant neoplasm of unspecified part of left bronchus or lung: Secondary | ICD-10-CM | POA: Diagnosis not present

## 2021-09-17 LAB — CBC AND DIFFERENTIAL
HCT: 40 — AB (ref 41–53)
Hemoglobin: 13.4 — AB (ref 13.5–17.5)
Neutrophils Absolute: 3.7
Platelets: 255 10*3/uL (ref 150–400)
WBC: 6.6

## 2021-09-17 LAB — CBC: RBC: 4.38 (ref 3.87–5.11)

## 2021-09-17 LAB — HEPATIC FUNCTION PANEL
ALT: 24 U/L (ref 10–40)
AST: 24 (ref 14–40)
Alkaline Phosphatase: 68 (ref 25–125)
Bilirubin, Total: 0.5

## 2021-09-17 LAB — BASIC METABOLIC PANEL
BUN: 11 (ref 4–21)
CO2: 24 — AB (ref 13–22)
Chloride: 94 — AB (ref 99–108)
Creatinine: 0.8 (ref 0.6–1.3)
Glucose: 136
Potassium: 4.4 mEq/L (ref 3.5–5.1)
Sodium: 127 — AB (ref 137–147)

## 2021-09-17 LAB — COMPREHENSIVE METABOLIC PANEL
Albumin: 4 (ref 3.5–5.0)
Calcium: 8.9 (ref 8.7–10.7)

## 2021-09-17 NOTE — Progress Notes (Signed)
START ON PATHWAY REGIMEN - Non-Small Cell Lung ? ? ?  A cycle is every 21 days: ?    Paclitaxel  ?    Carboplatin  ? ?**Always confirm dose/schedule in your pharmacy ordering system** ? ?Patient Characteristics: ?Postoperative without Neoadjuvant Therapy (Pathologic Staging), Stage IIB, Adjuvant Chemotherapy, Squamous Cell ?Therapeutic Status: Postoperative without Neoadjuvant Therapy (Pathologic Staging) ?AJCC T Category: pT2a ?AJCC N Category: pN1 ?AJCC M Category: cM0 ?AJCC 8 Stage Grouping: IIB ?Histology: Squamous Cell ?Intent of Therapy: ?Curative Intent, Discussed with Patient ?

## 2021-09-18 ENCOUNTER — Encounter: Payer: Self-pay | Admitting: Oncology

## 2021-09-22 ENCOUNTER — Telehealth: Payer: Self-pay

## 2021-09-22 NOTE — Telephone Encounter (Signed)
Mrs. Newstrom called to advise that she was away from home this past weekend and felt that Mr. Derek Richards did not eat as he should have.  He had a dizzy spell after supper last night and fell.  He was not hurt, did not lose consciousness but wanted [place] to be away.  I did advise to push fluids. ?

## 2021-09-23 ENCOUNTER — Other Ambulatory Visit: Payer: Self-pay

## 2021-09-25 ENCOUNTER — Encounter: Payer: Self-pay | Admitting: Hematology and Oncology

## 2021-09-25 ENCOUNTER — Other Ambulatory Visit: Payer: Self-pay | Admitting: Hematology and Oncology

## 2021-09-25 ENCOUNTER — Inpatient Hospital Stay: Payer: Medicare Other | Admitting: Hematology and Oncology

## 2021-09-25 ENCOUNTER — Inpatient Hospital Stay: Payer: Medicare Other

## 2021-09-25 VITALS — BP 146/88 | HR 89 | Temp 98.6°F | Resp 20 | Ht 68.0 in | Wt 193.4 lb

## 2021-09-25 DIAGNOSIS — C3492 Malignant neoplasm of unspecified part of left bronchus or lung: Secondary | ICD-10-CM | POA: Diagnosis not present

## 2021-09-25 LAB — CBC AND DIFFERENTIAL
HCT: 39 — AB (ref 41–53)
Hemoglobin: 12.9 — AB (ref 13.5–17.5)
Neutrophils Absolute: 3.17
Platelets: 259 10*3/uL (ref 150–400)
WBC: 6.1

## 2021-09-25 LAB — BASIC METABOLIC PANEL
BUN: 9 (ref 4–21)
CO2: 31 — AB (ref 13–22)
Chloride: 94 — AB (ref 99–108)
Creatinine: 0.9 (ref 0.6–1.3)
Glucose: 117
Potassium: 4.7 mEq/L (ref 3.5–5.1)
Sodium: 132 — AB (ref 137–147)

## 2021-09-25 LAB — HEPATIC FUNCTION PANEL
ALT: 21 U/L (ref 10–40)
AST: 24 (ref 14–40)
Alkaline Phosphatase: 66 (ref 25–125)
Bilirubin, Total: 0.6

## 2021-09-25 LAB — COMPREHENSIVE METABOLIC PANEL
Albumin: 4 (ref 3.5–5.0)
Calcium: 9.1 (ref 8.7–10.7)

## 2021-09-25 LAB — CBC: RBC: 4.19 (ref 3.87–5.11)

## 2021-09-25 MED ORDER — PROCHLORPERAZINE MALEATE 10 MG PO TABS: 10.0000 mg | ORAL_TABLET | Freq: Four times a day (QID) | ORAL | 3 refills | Status: DC | PRN

## 2021-09-25 MED ORDER — ONDANSETRON HCL 4 MG PO TABS: 4.0000 mg | ORAL_TABLET | ORAL | 3 refills | Status: DC | PRN

## 2021-09-25 MED ORDER — DEXAMETHASONE 4 MG PO TABS: ORAL_TABLET | ORAL | 0 refills | Status: DC
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
## 2021-09-26 ENCOUNTER — Encounter: Payer: Self-pay | Admitting: Oncology

## 2021-09-28 ENCOUNTER — Encounter: Payer: Self-pay | Admitting: Oncology

## 2021-09-29 ENCOUNTER — Other Ambulatory Visit: Payer: Self-pay | Admitting: Pharmacist

## 2021-09-29 ENCOUNTER — Encounter: Payer: Self-pay | Admitting: Oncology

## 2021-09-29 DIAGNOSIS — R112 Nausea with vomiting, unspecified: Secondary | ICD-10-CM

## 2021-09-29 DIAGNOSIS — E860 Dehydration: Secondary | ICD-10-CM

## 2021-09-29 NOTE — Progress Notes (Signed)
..  Pharmacist Chemotherapy Monitoring - Initial Assessment   ? ?Anticipated start date: 09/30/21 ? ?The following has been reviewed per standard work regarding the patient's treatment regimen: ?The patient's diagnosis, treatment plan and drug doses, and organ/hematologic function ?Lab orders and baseline tests specific to treatment regimen  ?The treatment plan start date, drug sequencing, and pre-medications ?Prior authorization status  ?Patient's documented medication list, including drug-drug interaction screen and prescriptions for anti-emetics and supportive care specific to the treatment regimen ?The drug concentrations, fluid compatibility, administration routes, and timing of the medications to be used ?The patient's access for treatment and lifetime cumulative dose history, if applicable  ?The patient's medication allergies and previous infusion related reactions, if applicable  ? ?Changes made to treatment plan:  ?Deleted pegfilgrastim from treatment plan and notified MD that insurance would not approve. ? ?Follow up needed:  ?N/A ? ? ?Juanetta Beets, [facility], ?09/29/2021  5:07 PM  ?

## 2021-09-29 NOTE — Progress Notes (Signed)
Pegfilgrastim was denied by insurance.  Peer to peer attempted with 2nd denial.  Reasoning was that this regimen was determined to fall into a low risk for febrile neutropenia category despite NCCN guidelines and patient risk factors.  Insurance representative stated that we can submit in the future if neutropenic event occurs. ?

## 2021-09-30 ENCOUNTER — Inpatient Hospital Stay: Payer: Medicare Other | Attending: Internal Medicine

## 2021-09-30 VITALS — BP 145/92 | HR 90 | Temp 98.2°F | Resp 18 | Ht 69.0 in | Wt 190.1 lb

## 2021-09-30 DIAGNOSIS — C3412 Malignant neoplasm of upper lobe, left bronchus or lung: Secondary | ICD-10-CM | POA: Diagnosis present

## 2021-09-30 DIAGNOSIS — Z5111 Encounter for antineoplastic chemotherapy: Secondary | ICD-10-CM | POA: Insufficient documentation

## 2021-09-30 DIAGNOSIS — C3492 Malignant neoplasm of unspecified part of left bronchus or lung: Secondary | ICD-10-CM

## 2021-09-30 MED ORDER — SODIUM CHLORIDE 0.9 % IV SOLN
200.0000 mg/m2 | Freq: Once | INTRAVENOUS | Status: AC
  Administered 2021-09-30: 408 mg via INTRAVENOUS
  Filled 2021-09-30: qty 68

## 2021-09-30 MED ORDER — SODIUM CHLORIDE 0.9% FLUSH
10.0000 mL | INTRAVENOUS | Status: DC | PRN
  Administered 2021-09-30: 10 mL

## 2021-09-30 MED ORDER — PALONOSETRON HCL INJECTION 0.25 MG/5ML
0.2500 mg | Freq: Once | INTRAVENOUS | Status: AC
  Administered 2021-09-30: 0.25 mg via INTRAVENOUS
  Filled 2021-09-30: qty 5

## 2021-09-30 MED ORDER — FAMOTIDINE IN NACL 20-0.9 MG/50ML-% IV SOLN
20.0000 mg | Freq: Once | INTRAVENOUS | Status: AC
  Administered 2021-09-30: 20 mg via INTRAVENOUS
  Filled 2021-09-30: qty 50

## 2021-09-30 MED ORDER — SODIUM CHLORIDE 0.9 % IV SOLN
10.0000 mg | Freq: Once | INTRAVENOUS | Status: AC
  Administered 2021-09-30: 10 mg via INTRAVENOUS
  Filled 2021-09-30: qty 10

## 2021-09-30 MED ORDER — DIPHENHYDRAMINE HCL 50 MG/ML IJ SOLN
50.0000 mg | Freq: Once | INTRAMUSCULAR | Status: AC
  Administered 2021-09-30: 50 mg via INTRAVENOUS
  Filled 2021-09-30: qty 1

## 2021-09-30 MED ORDER — SODIUM CHLORIDE 0.9 % IV SOLN
586.2000 mg | Freq: Once | INTRAVENOUS | Status: AC
  Administered 2021-09-30: 590 mg via INTRAVENOUS
  Filled 2021-09-30: qty 59

## 2021-09-30 MED ORDER — SODIUM CHLORIDE 0.9 % IV SOLN: Freq: Once | INTRAVENOUS | Status: AC

## 2021-09-30 MED ORDER — SODIUM CHLORIDE 0.9 % IV SOLN
150.0000 mg | Freq: Once | INTRAVENOUS | Status: AC
  Administered 2021-09-30: 150 mg via INTRAVENOUS
  Filled 2021-09-30: qty 150

## 2021-09-30 MED ORDER — HEPARIN SOD (PORK) LOCK FLUSH 100 UNIT/ML IV SOLN
500.0000 [IU] | Freq: Once | INTRAVENOUS | Status: AC | PRN
  Administered 2021-09-30: 500 [IU]

## 2021-09-30 NOTE — Patient Instructions (Signed)
[facility]  Discharge Instructions: ?Thank you for choosing [facility] to provide your oncology and hematology care.  ?If you have a lab appointment with the [facility], please go directly to the [facility] and check in at the registration area. ?  ?Wear comfortable clothing and clothing appropriate for easy access to any Portacath or PICC line.  ? ?We strive to give you quality time with your provider. You may need to reschedule your appointment if you arrive late (15 or more minutes).  Arriving late affects you and other patients whose appointments are after yours.  Also, if you miss three or more appointments without notifying the office, you may be dismissed from the clinic at the provider?s discretion.    ?  ?For prescription refill requests, have your pharmacy contact our office and allow 72 hours for refills to be completed.   ? ?Today you received the following chemotherapy and/or immunotherapy agents :Carboplatin and Taxol ?  ?To help prevent nausea and vomiting after your treatment, we encourage you to take your nausea medication as directed. ? ?BELOW ARE SYMPTOMS THAT SHOULD BE REPORTED IMMEDIATELY: ?*FEVER GREATER THAN 100.4 F (38 ?C) OR HIGHER ?*CHILLS OR SWEATING ?*NAUSEA AND VOMITING THAT IS NOT CONTROLLED WITH YOUR NAUSEA MEDICATION ?*UNUSUAL SHORTNESS OF BREATH ?*UNUSUAL BRUISING OR BLEEDING ?*URINARY PROBLEMS (pain or burning when urinating, or frequent urination) ?*BOWEL PROBLEMS (unusual diarrhea, constipation, pain near the anus) ?TENDERNESS IN MOUTH AND THROAT WITH OR WITHOUT PRESENCE OF ULCERS (sore throat, sores in mouth, or a toothache) ?UNUSUAL RASH, SWELLING OR PAIN  ?UNUSUAL VAGINAL DISCHARGE OR ITCHING  ? ?Items with * indicate a potential emergency and should be followed up as soon as possible or go to the Emergency Department if any problems should occur. ? ?Ondansetron Injection ?What is this medication? ? ?You were given a medication  related to Zofran today 09/30/21- Zofran will not work for three days ? ?Please use the Compazine for nausea for the first three days after chemo if you need nausea medication. On 10/03/21 You can  ?ONDANSETRON (on DAN se tron) prevents nausea and vomiting from chemotherapy, radiation, or surgery. It works by blocking substances in the body that may cause nausea or vomiting. It belongs to a groupd of medications called antiemetics. ?This medicine may be used for other purposes; ask your health care provider or pharmacist if you have questions. ?COMMON BRAND NAME(S): Zofran, Zofran in Dextrose, Zofran Solution ?What should I tell my care team before I take this medication? ?They need to know if you have any of these conditions: ?Heart disease ?History of irregular heartbeat ?Liver disease ?Low levels of magnesium or potassium in the blood ?An unusual or allergic reaction to ondansetron, granisetron, other medications, foods, dyes, or preservatives ?Pregnant or trying to get pregnant ?Breast-feeding ?How should I use this medication? ?This medication is for infusion into a vein. It is given in a hospital or clinic. ?Talk to your care team regarding the use of this medication in children. Special care may be needed. ?Overdosage: If you think you have taken too much of this medicine contact a poison control center or emergency room at once. ?NOTE: This medicine is only for you. Do not share this medicine with others. ?What if I miss a dose? ?This does not apply. ?What may interact with this medication? ?Do not take this medication with any of the following: ?Apomorphine ?Certain medications for fungal infections like fluconazole, itraconazole, ketoconazole, posaconazole, voriconazole ?Cisapride ?Dronedarone ?  Pimozide ?Thioridazine ?This medication may also interact with the following: ?Carbamazepine ?Certain medications for depression, anxiety, or psychotic disturbances ?Fentanyl ?Linezolid ?MAOIs like Carbex, Eldepryl,  Marplan, Nardil, and Parnate ?Methylene blue (injected into a vein) ?Other medications that prolong the QT interval (cause an abnormal heart rhythm) like dofetilide, ziprasidone ?Phenytoin ?Rifampicin ?Tramadol ?This list may not describe all possible interactions. Give your health care provider a list of all the medicines, herbs, non-prescription drugs, or dietary supplements you use. Also tell them if you smoke, drink alcohol, or use illegal drugs. Some items may interact with your medicine. ?What should I watch for while using this medication? ?Your condition will be monitored carefully while you are receiving this medication. ?What side effects may I notice from receiving this medication? ?Side effects that you should report to your care team as soon as possible: ?Allergic reactions--skin rash, itching, hives, swelling of the face, lips, tongue, or throat ?Bowel blockage--stomach cramping, unable to have a bowel movement or pass gas, loss of appetite, vomiting ?Chest pain (angina)--pain, pressure, or tightness in the chest, neck, back, or arms ?Heart rhythm changes--fast or irregular heartbeat, dizziness, feeling faint or lightheaded, chest pain, trouble breathing ?Irritability, confusion, fast or irregular heartbeat, muscle stiffness, twitching muscles, sweating, high fever, seizure, chills, vomiting, diarrhea, which may be signs of serotonin syndrome ?Side effects that usually do not require medical attention (report to your care team if they continue or are bothersome): ?Constipation ?Diarrhea ?General discomfort and fatigue ?Headache ?This list may not describe all possible side effects. Call your doctor for medical advice about side effects. You may report side effects to FDA at 1-800-FDA-1088. ?Where should I keep my medication? ?This medication is given in a hospital or clinic and will not be stored at home. ?NOTE: This sheet is a summary. It may not cover all possible information. If you have questions  about this medicine, talk to your doctor, pharmacist, or health care provider. ?? 2022 Elsevier/Gold Standard (2020-07-18 00:00:00) ?Prochlorperazine injection ?What is this medication? ?PROCHLORPERAZINE (proe klor PER a zeen) helps to control severe nausea and vomiting. This medicine is also used to treat schizophrenia. It can also help patients who experience anxiety that is not due to psychological illness. ?This medicine may be used for other purposes; ask your health care provider or pharmacist if you have questions. ?COMMON BRAND NAME(S): Compazine, Compazine Solution ?What should I tell my care team before I take this medication? ?They need to know if you have any of these conditions: ?blockage in your bowel ?brain tumor ?dementia ?diabetes ?difficulty swallowing ?glaucoma ?have trouble controlling your muscles ?head injury ?heart disease ?history of irregular heartbeat ?if you often drink alcohol ?liver disease ?low blood counts, like low white cell, platelet, or red cell counts ?low blood pressure ?lung or breathing disease, like asthma ?Parkinson's disease ?prostate disease ?seizures ?trouble passing urine ?an unusual or allergic reaction to prochlorperazine, other medicines, foods, dyes, or preservatives ?pregnant or trying to get pregnant ?breast-feeding ?How should I use this medication? ?This medicine is for injection into a muscle, or injection or infusion into a vein. It is given by a health care professional in a hospital or clinic setting. ?Talk to your pediatrician regarding the use of this medicine in children. While this drug may be prescribed for children as young as 68 years of age for selected conditions, precautions do apply. ?Overdosage: If you think you have taken too much of this medicine contact a poison control center or emergency room at once. ?NOTE: This  medicine is only for you. Do not share this medicine with others. ?What if I miss a dose? ?This does not apply. ?What may interact  with this medication? ?Do not take this medicine with any of the following medications: ?cisapride ?dofetilide ?dronedarone ?metoclopramide ?pimozide ?saquinavir ?thioridazine ?This medicine may also interact

## 2021-10-02 ENCOUNTER — Ambulatory Visit: Payer: Medicare Other

## 2021-10-05 ENCOUNTER — Encounter: Payer: Self-pay | Admitting: Hematology and Oncology

## 2021-10-05 ENCOUNTER — Other Ambulatory Visit: Payer: Self-pay | Admitting: Hematology and Oncology

## 2021-10-05 ENCOUNTER — Inpatient Hospital Stay: Payer: Medicare Other

## 2021-10-05 ENCOUNTER — Telehealth: Payer: Self-pay

## 2021-10-05 ENCOUNTER — Inpatient Hospital Stay (INDEPENDENT_AMBULATORY_CARE_PROVIDER_SITE_OTHER): Payer: Medicare Other | Admitting: Hematology and Oncology

## 2021-10-05 VITALS — BP 143/76 | HR 84 | Temp 98.0°F | Resp 18

## 2021-10-05 DIAGNOSIS — E860 Dehydration: Secondary | ICD-10-CM

## 2021-10-05 DIAGNOSIS — R112 Nausea with vomiting, unspecified: Secondary | ICD-10-CM

## 2021-10-05 DIAGNOSIS — C3492 Malignant neoplasm of unspecified part of left bronchus or lung: Secondary | ICD-10-CM

## 2021-10-05 DIAGNOSIS — Z5111 Encounter for antineoplastic chemotherapy: Secondary | ICD-10-CM | POA: Diagnosis not present

## 2021-10-05 LAB — BASIC METABOLIC PANEL
BUN: 23 — AB (ref 4–21)
CO2: 28 — AB (ref 13–22)
Chloride: 91 — AB (ref 99–108)
Creatinine: 1 (ref 0.6–1.3)
Glucose: 120
Potassium: 5.2 mEq/L — AB (ref 3.5–5.1)
Sodium: 129 — AB (ref 137–147)

## 2021-10-05 LAB — CBC AND DIFFERENTIAL
HCT: 44 (ref 41–53)
Hemoglobin: 14.5 (ref 13.5–17.5)
Neutrophils Absolute: 3.36
Platelets: 252 10*3/uL (ref 150–400)
WBC: 4.6

## 2021-10-05 LAB — COMPREHENSIVE METABOLIC PANEL
Albumin: 4.2 (ref 3.5–5.0)
Calcium: 9 (ref 8.7–10.7)

## 2021-10-05 LAB — HEPATIC FUNCTION PANEL
ALT: 30 U/L (ref 10–40)
AST: 26 (ref 14–40)
Alkaline Phosphatase: 61 (ref 25–125)
Bilirubin, Total: 0.7

## 2021-10-05 LAB — CBC: RBC: 4.74 (ref 3.87–5.11)

## 2021-10-05 MED ORDER — ONDANSETRON HCL 4 MG/2ML IJ SOLN
8.0000 mg | Freq: Once | INTRAMUSCULAR | Status: AC
  Administered 2021-10-05: 8 mg via INTRAVENOUS
  Filled 2021-10-05: qty 4

## 2021-10-05 MED ORDER — SODIUM CHLORIDE 0.9 % IV SOLN: Freq: Once | INTRAVENOUS | Status: AC

## 2021-10-05 MED ORDER — HEPARIN SOD (PORK) LOCK FLUSH 100 UNIT/ML IV SOLN
500.0000 [IU] | Freq: Once | INTRAVENOUS | Status: AC | PRN
  Administered 2021-10-05: 500 [IU]

## 2021-10-05 MED ORDER — SODIUM CHLORIDE 0.9% FLUSH
10.0000 mL | Freq: Once | INTRAVENOUS | Status: AC | PRN
  Administered 2021-10-05: 10 mL

## 2021-10-05 NOTE — Progress Notes (Signed)
Post-chemo follow up- Patient states he spent most of the weekend with severe nausea. Here today in clinic for fluids and Zofran.  ?

## 2021-10-05 NOTE — Assessment & Plan Note (Signed)
An 81 y.o. 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 He was placed on adjuvant carboplatin/paclitaxel chemotherapy.  He will receive a total of 4 cycles, with each cycle being repeated 3 weeks.  CT scans will be done afterwards to ascertain his new disease baseline.  If he remains disease-free, maintenance immunotherapy would also be considered. He received cycle 1 last week and presents today with new onset nausea/ vomiting, dizziness and overall muscle pain. We will plan for IVF today with IV zofran. CBC is unremarkable. CMP reveals sodium 129, BUN 23 and potassium 5.2, all most likely from dehydration. He will keep scheduled follow up with Dr. Bobby Rumpf.  ?

## 2021-10-05 NOTE — Progress Notes (Cosign Needed)
?Patient Care Team: ?[name], NP as PCP - General ?Valrie Hart, RN as Oncology Nurse Navigator (Oncology) ? ?Clinic Day:  10/05/2021 ? ?Referring physician: [name], NP ? ?ASSESSMENT & PLAN:  ? ?Assessment & Plan: ?Squamous cell carcinoma of lung, stage II, left (Derek Richards) ?An 81 y.o. 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 He was placed on adjuvant carboplatin/paclitaxel chemotherapy.  He will receive a total of 4 cycles, with each cycle being repeated 3 weeks.  CT scans will be done afterwards to ascertain his new disease baseline.  If he remains disease-free, maintenance immunotherapy would also be considered. He received cycle 1 last week and presents today with new onset nausea/ vomiting, dizziness and overall muscle pain. We will plan for IVF today with IV zofran. CBC is unremarkable. CMP reveals sodium 129, BUN 23 and potassium 5.2, all most likely from dehydration. He will keep scheduled follow up with Dr. Bobby Rumpf.  ?  ? ?The patient understands the plans discussed today and is in agreement with them.  He knows to contact our office if he develops concerns prior to his next appointment. ? ? ? ?Melodye Ped, NP  ?[address] ?[address] ?[place] [address] ?Dept: [phone] ?Dept Fax: [phone]  ? ?No orders of the defined types were placed in this encounter. ?  ? ? ?CHIEF COMPLAINT:  ?CC: An 81 year old male with history of lung cancer here for symptom management, mainly nausea/ vomiting.  ? ?Current Treatment:  Carboplatin, Paclitaxel ? ?INTERVAL HISTORY:  ?Derek Richards is here today for repeat clinical assessment. He denies fevers or chills. He denies pain. His appetite is good. His weight has been stable. ? ?I have reviewed the past medical history, past surgical history, social history and family history with the patient  and they are unchanged from previous note. ? ?ALLERGIES:  is allergic to chlorhexidine. ? ?MEDICATIONS:  ?Current Outpatient Medications  ?Medication Sig Dispense Refill  ? acetaminophen (TYLENOL) 500 MG tablet Take 500 mg by mouth every 6 (six) hours as needed.    ? aspirin 81 MG EC tablet Take 81 mg by mouth daily.    ? dexamethasone (DECADRON) 4 MG tablet Take 5 tabs at the night before and 5 tab the morning of chemotherapy, every 3 weeks, by mouth 60 tablet 0  ? diphenhydramine-acetaminophen (TYLENOL PM) 25-500 MG TABS tablet Take 1 tablet by mouth at bedtime as needed.    ? gabapentin (NEURONTIN) 300 MG capsule Take 1 capsule (300 mg total) by mouth at bedtime. (Patient not taking: Reported on 09/25/2021) 60 capsule 0  ? Lidocaine 4 % PTCH Apply 1 patch topically daily as needed (pain). (Patient not taking: Reported on 09/25/2021)    ? lisinopril (ZESTRIL) 10 MG tablet Take 10 mg by mouth daily.    ? Multiple Vitamin (MULTI-VITAMIN) tablet Take 1 tablet by mouth daily.    ? naproxen sodium (ALEVE) 220 MG tablet Take by mouth.    ? ondansetron (ZOFRAN) 4 MG tablet Take 1 tablet (4 mg total) by mouth every 4 (four) hours as needed for nausea. 90 tablet 3  ? pantoprazole (PROTONIX) 40 MG tablet Take 1 tablet by mouth daily.    ? prochlorperazine (COMPAZINE) 10 MG tablet Take 1 tablet (10 mg total) by mouth every 6 (six) hours as needed for nausea or  vomiting. 90 tablet 3  ? traMADol (ULTRAM) 50 MG tablet Take 1 tablet (50 mg total) by mouth every 6 (six) hours as needed for moderate pain (mild pain). (Patient not taking: Reported on 09/07/2021) 30 tablet 0  ? ?No current facility-administered medications for this visit.  ? ? ?HISTORY OF PRESENT ILLNESS:  ? ?Oncology History  ?Squamous cell carcinoma of lung, stage II, left ([name])  ?08/13/2021 Initial Diagnosis  ? Squamous cell carcinoma of lung, stage II, left ([name]) ?  ?09/07/2021 Cancer Staging  ? Staging form: Lung, AJCC 8th Edition ?- Clinical: Stage IIB (cT2a, cN1,  cM0) - Signed by Curt Bears, MD on 09/07/2021 ? ?  ?09/30/2021 -  Chemotherapy  ? Patient is on Treatment Plan : LUNG Carboplatin + Paclitaxel q21d  ?   ?  ? ? ?REVIEW OF SYSTEMS:  ? ?Constitutional: Denies fevers, chills or abnormal weight loss ?Eyes: Denies blurriness of vision ?Ears, nose, mouth, throat, and face: Denies mucositis or sore throat ?Respiratory: Denies cough, dyspnea or wheezes ?Cardiovascular: Denies palpitation, chest discomfort or lower extremity swelling ?Gastrointestinal:  Denies nausea, heartburn or change in bowel habits ?Skin: Denies abnormal skin rashes ?Lymphatics: Denies new lymphadenopathy or easy bruising ?Neurological:Denies numbness, tingling or new weaknesses ?Behavioral/Psych: Mood is stable, no new changes  ?All other systems were reviewed with the patient and are negative. ? ? ?VITALS:  ?Blood pressure (!) 102/51, pulse (!) 108, temperature 97.8 ?F (36.6 ?C), temperature source Oral, resp. rate 16, height 5\' 8"  (1.727 m), weight 186 lb 4.8 oz (84.5 kg), SpO2 98 %.  ?Wt Readings from Last 3 Encounters:  ?10/05/21 186 lb 4.8 oz (84.5 kg)  ?09/30/21 190 lb 1.9 oz (86.2 kg)  ?09/25/21 193 lb 6.4 oz (87.7 kg)  ?  ?Body mass index is 28.33 kg/m?. ? ?Performance status (ECOG): 1 - Symptomatic but completely ambulatory ? ?PHYSICAL EXAM:  ? ?GENERAL:alert, no distress and comfortable ?SKIN: skin color, texture, turgor are normal, no rashes or significant lesions ?EYES: normal, Conjunctiva are pink and non-injected, sclera clear ?OROPHARYNX:no exudate, no erythema and lips, buccal mucosa, and tongue normal  ?NECK: supple, thyroid normal size, non-tender, without nodularity ?LYMPH:  no palpable lymphadenopathy in the cervical, axillary or inguinal ?LUNGS: clear to auscultation and percussion with normal breathing effort ?HEART: regular rate & rhythm and no murmurs and no lower extremity edema ?ABDOMEN:abdomen soft, non-tender and normal bowel sounds ?Musculoskeletal:no cyanosis of digits  and no clubbing  ?NEURO: alert & oriented x 3 with fluent speech, no focal motor/sensory deficits ? ?LABORATORY DATA:  ?I have reviewed the data as listed ?   ?Component Value Date/Time  ? NA 129 (A) 10/05/2021 0000  ? K 5.2 (A) 10/05/2021 0000  ? CL 91 (A) 10/05/2021 0000  ? CO2 28 (A) 10/05/2021 0000  ? GLUCOSE 94 09/07/2021 1348  ? BUN 23 (A) 10/05/2021 0000  ? CREATININE 1.0 10/05/2021 0000  ? CREATININE 0.98 09/07/2021 1348  ? CALCIUM 9.0 10/05/2021 0000  ? PROT 7.0 09/07/2021 1348  ? ALBUMIN 4.2 10/05/2021 0000  ? AST 26 10/05/2021 0000  ? AST 17 09/07/2021 1348  ? ALT 30 10/05/2021 0000  ? ALT 22 09/07/2021 1348  ? ALKPHOS 61 10/05/2021 0000  ? BILITOT 0.5 09/07/2021 1348  ? GFRNONAA >60 09/07/2021 1348  ? ? ?No results found for: SPEP, UPEP ? ?Lab Results  ?Component Value Date  ? WBC 4.6 10/05/2021  ? NEUTROABS 3.36 10/05/2021  ? HGB 14.5 10/05/2021  ? HCT 44 10/05/2021  ?  MCV 90.6 09/07/2021  ? PLT 252 10/05/2021  ? ? ?  Chemistry   ?   ?Component Value Date/Time  ? NA 129 (A) 10/05/2021 0000  ? K 5.2 (A) 10/05/2021 0000  ? CL 91 (A) 10/05/2021 0000  ? CO2 28 (A) 10/05/2021 0000  ? BUN 23 (A) 10/05/2021 0000  ? CREATININE 1.0 10/05/2021 0000  ? CREATININE 0.98 09/07/2021 1348  ? GLU 120 10/05/2021 0000  ?    ?Component Value Date/Time  ? CALCIUM 9.0 10/05/2021 0000  ? ALKPHOS 61 10/05/2021 0000  ? AST 26 10/05/2021 0000  ? AST 17 09/07/2021 1348  ? ALT 30 10/05/2021 0000  ? ALT 22 09/07/2021 1348  ? BILITOT 0.5 09/07/2021 1348  ?  ? ? ? ?RADIOGRAPHIC STUDIES: ?I have personally reviewed the radiological images as listed and agreed with the findings in the report. ?No results found. ?

## 2021-10-05 NOTE — Telephone Encounter (Signed)
Post Chemo follow up call- No answer ? ?

## 2021-10-12 ENCOUNTER — Other Ambulatory Visit: Payer: Self-pay | Admitting: Thoracic Surgery (Cardiothoracic Vascular Surgery)

## 2021-10-12 DIAGNOSIS — C3492 Malignant neoplasm of unspecified part of left bronchus or lung: Secondary | ICD-10-CM

## 2021-10-13 ENCOUNTER — Ambulatory Visit
Admission: RE | Admit: 2021-10-13 | Discharge: 2021-10-13 | Disposition: A | Discharge status: Home / Self Care | Payer: Medicare Other | Source: Ambulatory Visit | Attending: Thoracic Surgery (Cardiothoracic Vascular Surgery) | Admitting: Thoracic Surgery (Cardiothoracic Vascular Surgery)

## 2021-10-13 ENCOUNTER — Encounter: Payer: Self-pay | Admitting: Thoracic Surgery (Cardiothoracic Vascular Surgery)

## 2021-10-13 ENCOUNTER — Ambulatory Visit (INDEPENDENT_AMBULATORY_CARE_PROVIDER_SITE_OTHER): Payer: Self-pay | Admitting: Thoracic Surgery (Cardiothoracic Vascular Surgery)

## 2021-10-13 VITALS — BP 120/71 | HR 104 | Resp 20 | Ht 68.0 in | Wt 188.0 lb

## 2021-10-13 DIAGNOSIS — C3492 Malignant neoplasm of unspecified part of left bronchus or lung: Secondary | ICD-10-CM

## 2021-10-13 DIAGNOSIS — Z9889 Other specified postprocedural states: Secondary | ICD-10-CM

## 2021-10-13 NOTE — Progress Notes (Signed)
? ?   ?[address] ?      [place] [address] ?            [phone]   ? ?HPI: Derek Richards returns for a scheduled follow-up visit after a robotic left upper lobectomy. ? ?Derek Richards is an 81 year old man with history of tobacco abuse, COPD, hypertension, and peripheral arterial disease.  He was found to have a cavitary lung nodule on a low-dose screening CT for lung cancer.  I did a robotic left upper lobectomy and node dissection on 08/10/2021.  Final pathology showed a T2, M1, stage IIb squamous cell carcinoma. ? ?He feels well.  He rarely will have a pins and needle sensation along the left costal margin.  He says that is decreasing in frequency and severity.  He is looking forward to playing golf next Tuesday. ? ?He did have a bad weekend after his first cycle of chemotherapy.  His second cycle is coming up soon. ? ?Past Medical History:  ?Diagnosis Date  ? Cancer [facility])   ? Hypertension   ? ? ?Current Outpatient Medications  ?Medication Sig Dispense Refill  ? aspirin 81 MG EC tablet Take 81 mg by mouth daily.    ? dexamethasone (DECADRON) 4 MG tablet Take 5 tabs at the night before and 5 tab the morning of chemotherapy, every 3 weeks, by mouth 60 tablet 0  ? diphenhydramine-acetaminophen (TYLENOL PM) 25-500 MG TABS tablet Take 1 tablet by mouth at bedtime as needed.    ? gabapentin (NEURONTIN) 300 MG capsule Take 1 capsule (300 mg total) by mouth at bedtime. 60 capsule 0  ? lisinopril (ZESTRIL) 10 MG tablet Take 10 mg by mouth daily.    ? Multiple Vitamin (MULTI-VITAMIN) tablet Take 1 tablet by mouth daily.    ? pantoprazole (PROTONIX) 40 MG tablet Take 1 tablet by mouth daily.    ? traMADol (ULTRAM) 50 MG tablet Take 1 tablet (50 mg total) by mouth every 6 (six) hours as needed for moderate pain (mild pain). 30 tablet 0  ? acetaminophen (TYLENOL) 500 MG tablet Take 500 mg by mouth every 6 (six) hours as needed. (Patient not taking: Reported on 10/13/2021)    ? Lidocaine 4 % PTCH Apply 1  patch topically daily as needed (pain). (Patient not taking: Reported on 10/13/2021)    ? naproxen sodium (ALEVE) 220 MG tablet Take by mouth. (Patient not taking: Reported on 10/13/2021)    ? ondansetron (ZOFRAN) 4 MG tablet Take 1 tablet (4 mg total) by mouth every 4 (four) hours as needed for nausea. (Patient not taking: Reported on 10/13/2021) 90 tablet 3  ? prochlorperazine (COMPAZINE) 10 MG tablet Take 1 tablet (10 mg total) by mouth every 6 (six) hours as needed for nausea or vomiting. (Patient not taking: Reported on 10/13/2021) 90 tablet 3  ? ?No current facility-administered medications for this visit.  ? ? ?Physical Exam ?BP 120/71 (BP Location: Right Arm, Patient Position: Sitting)   Pulse (!) 104   Resp 20   Ht 5\' 8"  (1.727 m)   Wt 188 lb (85.3 kg)   SpO2 94% Comment: RA  BMI 28.59 kg/m?  ?Well-appearing 81 year old man in no acute distress ?Alert and oriented x3 with no focal deficits ?Lungs diminished at left base but otherwise clear ?Cardiac regular rate and rhythm ?Incisions well-healed ? ?Diagnostic Tests: ?I personally reviewed his chest x-ray images.  Shows postoperative changes. ? ?Impression: ?Derek Richards is an 81 year old man with a history of  tobacco abuse who was found to have a lung nodule on a low-dose screening CT. he underwent a robotic assisted left upper lobectomy on 08/10/2021.  Pathology showed a stage IIb squamous cell carcinoma. ? ?Stage IIb squamous cell carcinoma-has completed 1 of 4 cycles of chemotherapy.  He had a lot of nausea and felt poorly after the first treatment.  Being followed by Dr. Bobby Rumpf. ? 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  Not on any medications.  Overall feels well and has resumed normal activities. ? ?Plan: ?Follow-up with Dr. Bobby Rumpf. ?I will be happy to see [name] back anytime in the future if I can be of any further assistance with his care ? ?Derek Nakayama, MD ?[facility] ?[phone] ? ? ? ? ?

## 2021-10-18 NOTE — Progress Notes (Deleted)
START ON PATHWAY REGIMEN - Non-Small Cell Lung ? ? ?  A cycle is every 21 days: ?    Paclitaxel  ?    Carboplatin  ? ?**Always confirm dose/schedule in your pharmacy ordering system** ? ?Patient Characteristics: ?Postoperative without Neoadjuvant Therapy (Pathologic Staging), Stage IIB, Adjuvant Chemotherapy, Squamous Cell ?Therapeutic Status: Postoperative without Neoadjuvant Therapy (Pathologic Staging) ?AJCC T Category: pT2a ?AJCC N Category: pN1 ?AJCC M Category: cM0 ?AJCC 8 Stage Grouping: IIB ?Histology: Squamous Cell ?Intent of Therapy: ?Curative Intent, Discussed with Patient ?

## 2021-10-19 ENCOUNTER — Other Ambulatory Visit: Payer: Self-pay

## 2021-10-19 ENCOUNTER — Other Ambulatory Visit: Payer: Self-pay | Admitting: Hematology and Oncology

## 2021-10-19 ENCOUNTER — Inpatient Hospital Stay: Payer: Medicare Other

## 2021-10-19 ENCOUNTER — Telehealth: Payer: Self-pay | Admitting: Oncology

## 2021-10-19 ENCOUNTER — Inpatient Hospital Stay: Payer: Medicare Other | Admitting: Oncology

## 2021-10-19 VITALS — BP 132/64 | HR 105 | Temp 98.4°F | Resp 16 | Ht 68.0 in | Wt 189.6 lb

## 2021-10-19 DIAGNOSIS — C3492 Malignant neoplasm of unspecified part of left bronchus or lung: Secondary | ICD-10-CM

## 2021-10-19 LAB — BASIC METABOLIC PANEL
BUN: 11 (ref 4–21)
CO2: 28 — AB (ref 13–22)
Chloride: 93 — AB (ref 99–108)
Creatinine: 1 (ref 0.6–1.3)
Glucose: 127
Potassium: 4.5 mEq/L (ref 3.5–5.1)
Sodium: 130 — AB (ref 137–147)

## 2021-10-19 LAB — COMPREHENSIVE METABOLIC PANEL
Albumin: 4.2 (ref 3.5–5.0)
Calcium: 8.8 (ref 8.7–10.7)

## 2021-10-19 LAB — CBC
MCV: 90 (ref 80–94)
RBC: 4.03 (ref 3.87–5.11)

## 2021-10-19 LAB — HEPATIC FUNCTION PANEL
ALT: 24 U/L (ref 10–40)
AST: 22 (ref 14–40)
Alkaline Phosphatase: 75 (ref 25–125)
Bilirubin, Total: 0.5

## 2021-10-19 LAB — CBC AND DIFFERENTIAL
HCT: 36 — AB (ref 41–53)
Hemoglobin: 12.2 — AB (ref 13.5–17.5)
Neutrophils Absolute: 2.75
Platelets: 259 10*3/uL (ref 150–400)
WBC: 5.4

## 2021-10-19 NOTE — Progress Notes (Signed)
?[facility]  ?[address] ?[place],  [place]  [address] ?[phone] ? ?Clinic Day:  09/18/2021 ? ?Referring physician: [name], NP ? ? ?HISTORY OF PRESENT ILLNESS:  ?The patient is an 81 y.o. 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  He comes in today to be evaluated before heading into his second cycle of adjuvant carboplatin/paclitaxel chemotherapy.  He claims to tolerated his first cycle of treatment okay.  He did have dehydration to where IV fluids had to be given.  His wife recalls him also being hypotensive.  However, despite this, he still took his lisinopril for that particular day.  The patient has since recovered from his dehydration.  The only other issue which concerns him is the fact that he has lost all of his hair from his first cycle of chemotherapy.  As it pertains to his lung cancer, he denies having any new symptoms or findings which concern him for disease recurrence. ? ?PHYSICAL EXAM:  ?Blood pressure 132/64, pulse (!) 105, temperature 98.4 ?F (36.9 ?C), resp. rate 16, height 5\' 8"  (1.727 m), weight 189 lb 9.6 oz (86 kg), SpO2 98 %. ?Wt Readings from Last 3 Encounters:  ?10/19/21 189 lb 9.6 oz (86 kg)  ?10/13/21 188 lb (85.3 kg)  ?10/05/21 186 lb 4.8 oz (84.5 kg)  ? ?Body mass index is 28.83 kg/m?Marland Kitchen ?Performance status (ECOG): 1 - Symptomatic but completely ambulatory ?Physical Exam ?Constitutional:   ?   Appearance: Normal appearance. He is not ill-appearing.  ?HENT:  ?   Mouth/Throat:  ?   Mouth: Mucous membranes are moist.  ?   Pharynx: Oropharynx is clear. No oropharyngeal exudate or posterior oropharyngeal erythema.  ?Cardiovascular:  ?   Rate and Rhythm: Normal rate and regular rhythm.  ?   Heart sounds: No murmur heard. ?  No friction rub. No gallop.  ?Pulmonary:  ?   Effort: Pulmonary effort is normal. No respiratory distress.  ?   Breath sounds: Normal breath sounds. No wheezing, rhonchi or rales.  ?Abdominal:  ?   General:  Bowel sounds are normal. There is no distension.  ?   Palpations: Abdomen is soft. There is no mass.  ?   Tenderness: There is no abdominal tenderness.  ?Musculoskeletal:     ?   General: No swelling.  ?   Right lower leg: No edema.  ?   Left lower leg: No edema.  ?Lymphadenopathy:  ?   Cervical: No cervical adenopathy.  ?   Upper Body:  ?   Right upper body: No supraclavicular or axillary adenopathy.  ?   Left upper body: No supraclavicular or axillary adenopathy.  ?   Lower Body: No right inguinal adenopathy. No left inguinal adenopathy.  ?Skin: ?   General: Skin is warm.  ?   Coloration: Skin is not jaundiced.  ?   Findings: No lesion or rash.  ?Neurological:  ?   General: No focal deficit present.  ?   Mental Status: He is alert and oriented to person, place, and time. Mental status is at baseline.  ?Psychiatric:     ?   Mood and Affect: Mood normal.     ?   Behavior: Behavior normal.     ?   Thought Content: Thought content normal.  ? ? ?LABS:  ? ? ?  Latest Ref Rng & Units 10/19/2021  ? 12:00 AM 10/05/2021  ? 12:00 AM 09/25/2021  ? 12:00 AM  ?  CBC  ?WBC  5.4      4.6      6.1       ?Hemoglobin 13.5 - 17.5 12.2      14.5      12.9       ?Hematocrit 41 - 53 36      44      39       ?Platelets 150 - 400 K/uL 259      252      259       ?  ? This result is from an external source.  ? ? ? ?  Latest Ref Rng & Units 10/19/2021  ? 12:00 AM 10/05/2021  ? 12:00 AM 09/25/2021  ? 12:00 AM  ?CMP  ?BUN 4 - 21 11      23      9        ?Creatinine 0.6 - 1.3 1.0      1.0      0.9       ?Sodium 137 - 147 130      129      132       ?Potassium 3.5 - 5.1 mEq/L 4.5      5.2      4.7       ?Chloride 99 - 108 93      91      94       ?CO2 13 - 22 28      28      31        ?Calcium 8.7 - 10.7 8.8      9.0      9.1       ?Alkaline Phos 25 - 125 75      61      66       ?AST 14 - 40 22      26      24        ?ALT 10 - 40 U/L 24      30      21        ?  ? This result is from an external source.  ? ? ?ASSESSMENT & PLAN:  ?An 81 y.o. 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  He will proceed with his 2nd cycle of carboplatin/paclitaxel this week.  I will also arrange for him to receive IV fluids with this cycle of chemotherapy to offset any possible degree of dehydration he may experience.  I also explained to his wife that if his systolic blood pressure ever falls below 100, his lisinopril should be held to prevent worsening hypotension.  Clinically, the patient appears to be doing well.  I will see him back 3 weeks later before he heads into his 3rd cycle of adjuvant carboplatin/paclitaxel chemotherapy.  The patient understands all the plans discussed today and is in agreement with them. ? ?Timia Casselman Macarthur Critchley, MD   ? ? ?  ?

## 2021-10-19 NOTE — Telephone Encounter (Signed)
Per 10/19/21 los next appt scheduled and confirmed with patient ?

## 2021-10-20 ENCOUNTER — Encounter: Payer: Self-pay | Admitting: Oncology

## 2021-10-21 ENCOUNTER — Encounter: Payer: Self-pay | Admitting: Oncology

## 2021-10-21 ENCOUNTER — Inpatient Hospital Stay: Payer: Medicare Other

## 2021-10-21 VITALS — BP 152/80 | HR 101 | Temp 97.8°F | Resp 18 | Ht 69.0 in | Wt 191.0 lb

## 2021-10-21 DIAGNOSIS — Z5111 Encounter for antineoplastic chemotherapy: Secondary | ICD-10-CM | POA: Diagnosis not present

## 2021-10-21 DIAGNOSIS — C3492 Malignant neoplasm of unspecified part of left bronchus or lung: Secondary | ICD-10-CM

## 2021-10-21 MED ORDER — SODIUM CHLORIDE 0.9 % IV SOLN
586.2000 mg | Freq: Once | INTRAVENOUS | Status: AC
  Administered 2021-10-21: 590 mg via INTRAVENOUS
  Filled 2021-10-21: qty 59

## 2021-10-21 MED ORDER — HEPARIN SOD (PORK) LOCK FLUSH 100 UNIT/ML IV SOLN
500.0000 [IU] | Freq: Once | INTRAVENOUS | Status: AC | PRN
  Administered 2021-10-21: 500 [IU]

## 2021-10-21 MED ORDER — SODIUM CHLORIDE 0.9% FLUSH
10.0000 mL | INTRAVENOUS | Status: DC | PRN
  Administered 2021-10-21: 10 mL

## 2021-10-21 MED ORDER — SODIUM CHLORIDE 0.9 % IV SOLN: Freq: Once | INTRAVENOUS | Status: AC

## 2021-10-21 MED ORDER — DIPHENHYDRAMINE HCL 50 MG/ML IJ SOLN
50.0000 mg | Freq: Once | INTRAMUSCULAR | Status: AC
  Administered 2021-10-21: 50 mg via INTRAVENOUS
  Filled 2021-10-21: qty 1

## 2021-10-21 MED ORDER — FAMOTIDINE IN NACL 20-0.9 MG/50ML-% IV SOLN
20.0000 mg | Freq: Once | INTRAVENOUS | Status: AC
  Administered 2021-10-21: 20 mg via INTRAVENOUS
  Filled 2021-10-21: qty 50

## 2021-10-21 MED ORDER — SODIUM CHLORIDE 0.9 % IV SOLN
10.0000 mg | Freq: Once | INTRAVENOUS | Status: AC
  Administered 2021-10-21: 10 mg via INTRAVENOUS
  Filled 2021-10-21: qty 10

## 2021-10-21 MED ORDER — SODIUM CHLORIDE 0.9 % IV SOLN
150.0000 mg | Freq: Once | INTRAVENOUS | Status: AC
  Administered 2021-10-21: 150 mg via INTRAVENOUS
  Filled 2021-10-21: qty 150

## 2021-10-21 MED ORDER — PALONOSETRON HCL INJECTION 0.25 MG/5ML
0.2500 mg | Freq: Once | INTRAVENOUS | Status: AC
  Administered 2021-10-21: 0.25 mg via INTRAVENOUS
  Filled 2021-10-21: qty 5

## 2021-10-21 MED ORDER — SODIUM CHLORIDE 0.9 % IV SOLN
200.0000 mg/m2 | Freq: Once | INTRAVENOUS | Status: AC
  Administered 2021-10-21: 408 mg via INTRAVENOUS
  Filled 2021-10-21: qty 68

## 2021-10-21 NOTE — Patient Instructions (Signed)
Carboplatin injection ?What is this medication? ?CARBOPLATIN (KAR boe pla tin) is a chemotherapy drug. It targets fast dividing cells, like cancer cells, and causes these cells to die. This medicine is used to treat ovarian cancer and many other cancers. ?This medicine may be used for other purposes; ask your health care provider or pharmacist if you have questions. ?COMMON BRAND NAME(S): Paraplatin ?What should I tell my care team before I take this medication? ?They need to know if you have any of these conditions: ?blood disorders ?hearing problems ?kidney disease ?recent or ongoing radiation therapy ?an unusual or allergic reaction to carboplatin, cisplatin, other chemotherapy, other medicines, foods, dyes, or preservatives ?pregnant or trying to get pregnant ?breast-feeding ?How should I use this medication? ?This drug is usually given as an infusion into a vein. It is administered in a hospital or clinic by a specially trained health care professional. ?Talk to your pediatrician regarding the use of this medicine in children. Special care may be needed. ?Overdosage: If you think you have taken too much of this medicine contact a poison control center or emergency room at once. ?NOTE: This medicine is only for you. Do not share this medicine with others. ?What if I miss a dose? ?It is important not to miss a dose. Call your doctor or health care professional if you are unable to keep an appointment. ?What may interact with this medication? ?medicines for seizures ?medicines to increase blood counts like filgrastim, pegfilgrastim, sargramostim ?some antibiotics like amikacin, gentamicin, neomycin, streptomycin, tobramycin ?vaccines ?Talk to your doctor or health care professional before taking any of these medicines: ?acetaminophen ?aspirin ?ibuprofen ?ketoprofen ?naproxen ?This list may not describe all possible interactions. Give your health care provider a list of all the medicines, herbs, non-prescription  drugs, or dietary supplements you use. Also tell them if you smoke, drink alcohol, or use illegal drugs. Some items may interact with your medicine. ?What should I watch for while using this medication? ?Your condition will be monitored carefully while you are receiving this medicine. You will need important blood work done while you are taking this medicine. ?This drug may make you feel generally unwell. This is not uncommon, as chemotherapy can affect healthy cells as well as cancer cells. Report any side effects. Continue your course of treatment even though you feel ill unless your doctor tells you to stop. ?In some cases, you may be given additional medicines to help with side effects. Follow all directions for their use. ?Call your doctor or health care professional for advice if you get a fever, chills or sore throat, or other symptoms of a cold or flu. Do not treat yourself. This drug decreases your body's ability to fight infections. Try to avoid being around people who are sick. ?This medicine may increase your risk to bruise or bleed. Call your doctor or health care professional if you notice any unusual bleeding. ?Be careful brushing and flossing your teeth or using a toothpick because you may get an infection or bleed more easily. If you have any dental work done, tell your dentist you are receiving this medicine. ?Avoid taking products that contain aspirin, acetaminophen, ibuprofen, naproxen, or ketoprofen unless instructed by your doctor. These medicines may hide a fever. ?Do not become pregnant while taking this medicine. Women should inform their doctor if they wish to become pregnant or think they might be pregnant. There is a potential for serious side effects to an unborn child. Talk to your health care professional or pharmacist  for more information. Do not breast-feed an infant while taking this medicine. ?What side effects may I notice from receiving this medication? ?Side effects that you  should report to your doctor or health care professional as soon as possible: ?allergic reactions like skin rash, itching or hives, swelling of the face, lips, or tongue ?signs of infection - fever or chills, cough, sore throat, pain or difficulty passing urine ?signs of decreased platelets or bleeding - bruising, pinpoint red spots on the skin, black, tarry stools, nosebleeds ?signs of decreased red blood cells - unusually weak or tired, fainting spells, lightheadedness ?breathing problems ?changes in hearing ?changes in vision ?chest pain ?high blood pressure ?low blood counts - This drug may decrease the number of white blood cells, red blood cells and platelets. You may be at increased risk for infections and bleeding. ?nausea and vomiting ?pain, swelling, redness or irritation at the injection site ?pain, tingling, numbness in the hands or feet ?problems with balance, talking, walking ?trouble passing urine or change in the amount of urine ?Side effects that usually do not require medical attention (report to your doctor or health care professional if they continue or are bothersome): ?hair loss ?loss of appetite ?metallic taste in the mouth or changes in taste ?This list may not describe all possible side effects. Call your doctor for medical advice about side effects. You may report side effects to FDA at 1-800-FDA-1088. ?Where should I keep my medication? ?This drug is given in a hospital or clinic and will not be stored at home. ?NOTE: This sheet is a summary. It may not cover all possible information. If you have questions about this medicine, talk to your doctor, pharmacist, or health care provider. ?? 2023 Elsevier/Gold Standard (2007-11-22 00:00:00) ?Paclitaxel injection ?What is this medication? ?PACLITAXEL (PAK li TAX el) is a chemotherapy drug. It targets fast dividing cells, like cancer cells, and causes these cells to die. This medicine is used to treat ovarian cancer, breast cancer, lung cancer,  Kaposi's sarcoma, and other cancers. ?This medicine may be used for other purposes; ask your health care provider or pharmacist if you have questions. ?COMMON BRAND NAME(S): Onxol, Taxol ?What should I tell my care team before I take this medication? ?They need to know if you have any of these conditions: ?history of irregular heartbeat ?liver disease ?low blood counts, like low white cell, platelet, or red cell counts ?lung or breathing disease, like asthma ?tingling of the fingers or toes, or other nerve disorder ?an unusual or allergic reaction to paclitaxel, alcohol, polyoxyethylated castor oil, other chemotherapy, other medicines, foods, dyes, or preservatives ?pregnant or trying to get pregnant ?breast-feeding ?How should I use this medication? ?This drug is given as an infusion into a vein. It is administered in a hospital or clinic by a specially trained health care professional. ?Talk to your pediatrician regarding the use of this medicine in children. Special care may be needed. ?Overdosage: If you think you have taken too much of this medicine contact a poison control center or emergency room at once. ?NOTE: This medicine is only for you. Do not share this medicine with others. ?What if I miss a dose? ?It is important not to miss your dose. Call your doctor or health care professional if you are unable to keep an appointment. ?What may interact with this medication? ?Do not take this medicine with any of the following medications: ?live virus vaccines ?This medicine may also interact with the following medications: ?antiviral medicines for hepatitis,  HIV or AIDS ?certain antibiotics like erythromycin and clarithromycin ?certain medicines for fungal infections like ketoconazole and itraconazole ?certain medicines for seizures like carbamazepine, phenobarbital, phenytoin ?gemfibrozil ?nefazodone ?rifampin ?St. John's wort ?This list may not describe all possible interactions. Give your health care provider  a list of all the medicines, herbs, non-prescription drugs, or dietary supplements you use. Also tell them if you smoke, drink alcohol, or use illegal drugs. Some items may interact with your medicine. ?Wha

## 2021-10-23 ENCOUNTER — Ambulatory Visit: Payer: Medicare Other

## 2021-10-26 ENCOUNTER — Telehealth: Payer: Self-pay

## 2021-10-26 ENCOUNTER — Inpatient Hospital Stay: Payer: Medicare Other

## 2021-10-26 ENCOUNTER — Other Ambulatory Visit: Payer: Self-pay | Admitting: Hematology and Oncology

## 2021-10-26 ENCOUNTER — Inpatient Hospital Stay: Payer: Medicare Other | Attending: Internal Medicine | Admitting: Hematology and Oncology

## 2021-10-26 ENCOUNTER — Encounter: Payer: Self-pay | Admitting: Hematology and Oncology

## 2021-10-26 VITALS — BP 142/70 | HR 99 | Temp 98.1°F | Resp 18 | Ht 68.0 in | Wt 189.4 lb

## 2021-10-26 DIAGNOSIS — Z79899 Other long term (current) drug therapy: Secondary | ICD-10-CM | POA: Insufficient documentation

## 2021-10-26 DIAGNOSIS — E860 Dehydration: Secondary | ICD-10-CM

## 2021-10-26 DIAGNOSIS — R112 Nausea with vomiting, unspecified: Secondary | ICD-10-CM | POA: Diagnosis not present

## 2021-10-26 DIAGNOSIS — Z7982 Long term (current) use of aspirin: Secondary | ICD-10-CM | POA: Insufficient documentation

## 2021-10-26 DIAGNOSIS — C3492 Malignant neoplasm of unspecified part of left bronchus or lung: Secondary | ICD-10-CM

## 2021-10-26 DIAGNOSIS — C3412 Malignant neoplasm of upper lobe, left bronchus or lung: Secondary | ICD-10-CM | POA: Insufficient documentation

## 2021-10-26 DIAGNOSIS — Z5111 Encounter for antineoplastic chemotherapy: Secondary | ICD-10-CM | POA: Insufficient documentation

## 2021-10-26 DIAGNOSIS — Z5189 Encounter for other specified aftercare: Secondary | ICD-10-CM | POA: Diagnosis not present

## 2021-10-26 LAB — BASIC METABOLIC PANEL
BUN: 15 (ref 4–21)
CO2: 27 — AB (ref 13–22)
Chloride: 93 — AB (ref 99–108)
Creatinine: 0.7 (ref 0.6–1.3)
Glucose: 113
Potassium: 4.2 mEq/L (ref 3.5–5.1)
Sodium: 128 — AB (ref 137–147)

## 2021-10-26 LAB — COMPREHENSIVE METABOLIC PANEL
Albumin: 3.5 (ref 3.5–5.0)
Calcium: 8.5 — AB (ref 8.7–10.7)

## 2021-10-26 LAB — CBC AND DIFFERENTIAL
HCT: 35 — AB (ref 41–53)
Hemoglobin: 12 — AB (ref 13.5–17.5)
Neutrophils Absolute: 1.45
Platelets: 157 10*3/uL (ref 150–400)
WBC: 3.3

## 2021-10-26 LAB — HEPATIC FUNCTION PANEL
ALT: 26 U/L (ref 10–40)
AST: 22 (ref 14–40)
Alkaline Phosphatase: 61 (ref 25–125)
Bilirubin, Total: 0.4

## 2021-10-26 LAB — CBC: RBC: 3.97 (ref 3.87–5.11)

## 2021-10-26 MED ORDER — HEPARIN SOD (PORK) LOCK FLUSH 100 UNIT/ML IV SOLN
500.0000 [IU] | Freq: Once | INTRAVENOUS | Status: AC
  Administered 2021-10-26: 500 [IU] via INTRAVENOUS

## 2021-10-26 MED ORDER — SODIUM CHLORIDE 0.9 % IV SOLN: INTRAVENOUS | Status: DC

## 2021-10-26 MED ORDER — SODIUM CHLORIDE 0.9% FLUSH
3.0000 mL | Freq: Once | INTRAVENOUS | Status: AC | PRN
  Administered 2021-10-26: 3 mL

## 2021-10-26 NOTE — Progress Notes (Cosign Needed)
?Patient Care Team: ?[name], NP as PCP - General ?Valrie Hart, RN as Oncology Nurse Navigator (Oncology) ? ?Clinic Day:  10/26/2021 ? ?Referring physician: [name], NP ? ?ASSESSMENT & PLAN:  ? ?Assessment & Plan: ?Squamous cell carcinoma of lung, stage II, left ([place]) ?An 81 y.o. 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 He was placed on adjuvant carboplatin/paclitaxel chemotherapy.  He will receive a total of 4 cycles, with each cycle being repeated 3 weeks.  CT scans will be done afterwards to ascertain his new disease baseline.  If he remains disease-free, maintenance immunotherapy would also be considered. He received cycle 1 and presented last week for symptoms of nausea and vomiting. He was given IVF and zofran and felt much better. Over the weekend, he had several episodes of diarrhea and presents today with weakness. We will plan for more IVF. He will keep scheduled follow up with Dr. Bobby Rumpf.  ? ?The patient understands the plans discussed today and is in agreement with them.  He knows to contact our office if he develops concerns prior to his next appointment. ? ? ? ?Melodye Ped, NP  ?[facility] [address] [place] [address] ?Dept: [phone] ?Dept Fax: [phone]  ? ?Orders Placed This Encounter  ?Procedures  ? CBC and differential  ?  This external order was created through the Results Console.  ? CBC  ?  This external order was created through the Results Console.  ? Basic metabolic panel  ?  This external order was created through the Results Console.  ? Comprehensive metabolic panel  ?  This external order was created through the Results Console.  ? Hepatic function panel  ?  This external order was created through the Results Console.  ?  ? ? ?CHIEF COMPLAINT:  ?CC: An 81 year old male with history of  lung cancer here for symptom management mainly diarrhea. ? ?Current Treatment:  Carboplatin/ Paclitaxel ? ?INTERVAL HISTORY:  ?Bettie is here today for repeat clinical assessment. He denies fevers or chills. He denies pain. His appetite is good. His weight has been stable. ? ?I have reviewed the past medical history, past surgical history, social history and family history with the patient and they are unchanged from previous note. ? ?ALLERGIES:  is allergic to chlorhexidine. ? ?MEDICATIONS:  ?Current Outpatient Medications  ?Medication Sig Dispense Refill  ? acetaminophen (TYLENOL) 500 MG tablet Take 500 mg by mouth every 6 (six) hours as needed. (Patient not taking: Reported on 10/13/2021)    ? aspirin 81 MG EC tablet Take 81 mg by mouth daily.    ? dexamethasone (DECADRON) 4 MG tablet Take 5 tabs at the night before and 5 tab the morning of chemotherapy, every 3 weeks, by mouth 60 tablet 0  ? diphenhydramine-acetaminophen (TYLENOL PM) 25-500 MG TABS tablet Take 1 tablet by mouth at bedtime as needed.    ? gabapentin (NEURONTIN) 300 MG capsule Take 1 capsule (300 mg total) by mouth at bedtime. 60 capsule 0  ? Lidocaine 4 % PTCH Apply 1 patch topically daily as needed (pain). (Patient not taking: Reported on 10/13/2021)    ? lisinopril (ZESTRIL) 10 MG tablet Take 10 mg by mouth daily.    ? Multiple Vitamin (MULTI-VITAMIN) tablet Take 1 tablet by mouth daily.    ? naproxen sodium (  ALEVE) 220 MG tablet Take by mouth. (Patient not taking: Reported on 10/13/2021)    ? ondansetron (ZOFRAN) 4 MG tablet Take 1 tablet (4 mg total) by mouth every 4 (four) hours as needed for nausea. (Patient not taking: Reported on 10/13/2021) 90 tablet 3  ? pantoprazole (PROTONIX) 40 MG tablet Take 1 tablet by mouth daily.    ? prochlorperazine (COMPAZINE) 10 MG tablet Take 1 tablet (10 mg total) by mouth every 6 (six) hours as needed for nausea or vomiting. (Patient not taking: Reported on 10/13/2021) 90 tablet 3  ? traMADol (ULTRAM) 50 MG  tablet Take 1 tablet (50 mg total) by mouth every 6 (six) hours as needed for moderate pain (mild pain). 30 tablet 0  ? ?Current Facility-Administered Medications  ?Medication Dose Route Frequency Provider Last Rate Last Admin  ? 0.9 %  sodium chloride infusion   Intravenous Continuous [place] Scrape A, NP 999 mL/hr at 10/26/21 1340 New Bag at 10/26/21 1340  ? heparin lock flush 100 unit/mL  500 Units Intravenous Once [place] Scrape A, NP      ? sodium chloride flush (NS) 0.9 % injection 3 mL  3 mL Intracatheter Once PRN Melodye Ped, NP      ? ? ?HISTORY OF PRESENT ILLNESS:  ? ?Oncology History  ?Squamous cell carcinoma of lung, stage II, left ([place])  ?08/13/2021 Initial Diagnosis  ? Squamous cell carcinoma of lung, stage II, left ([place]) ? ?  ?09/07/2021 Cancer Staging  ? Staging form: Lung, AJCC 8th Edition ?- Clinical: Stage IIB (cT2a, cN1, cM0) - Signed by Curt Bears, MD on 09/07/2021 ? ?  ?09/30/2021 -  Chemotherapy  ? Patient is on Treatment Plan : LUNG Carboplatin + Paclitaxel q21d  ? ?  ?  ?  ? ? ?REVIEW OF SYSTEMS:  ? ?Constitutional: Denies fevers, chills or abnormal weight loss ?Eyes: Denies blurriness of vision ?Ears, nose, mouth, throat, and face: Denies mucositis or sore throat ?Respiratory: Denies cough, dyspnea or wheezes ?Cardiovascular: Denies palpitation, chest discomfort or lower extremity swelling ?Gastrointestinal:  Denies nausea, heartburn or change in bowel habits ?Skin: Denies abnormal skin rashes ?Lymphatics: Denies new lymphadenopathy or easy bruising ?Neurological:Denies numbness, tingling or new weaknesses ?Behavioral/Psych: Mood is stable, no new changes  ?All other systems were reviewed with the patient and are negative. ? ? ?VITALS:  ?Blood pressure (!) 142/70, pulse 99, temperature 98.1 ?F (36.7 ?C), temperature source Oral, resp. rate 18, height 5\' 8"  (1.727 m), weight 189 lb 6.4 oz (85.9 kg), SpO2 97 %.  ?Wt Readings from Last 3 Encounters:  ?10/26/21 189 lb 6.4 oz (85.9  kg)  ?10/21/21 191 lb 0.6 oz (86.7 kg)  ?10/19/21 189 lb 9.6 oz (86 kg)  ?  ?Body mass index is 28.8 kg/m?. ? ?Performance status (ECOG): 1 - Symptomatic but completely ambulatory ? ?PHYSICAL EXAM:  ? ?GENERAL:alert, no distress and comfortable ?SKIN: skin color, texture, turgor are normal, no rashes or significant lesions ?EYES: normal, Conjunctiva are pink and non-injected, sclera clear ?OROPHARYNX:no exudate, no erythema and lips, buccal mucosa, and tongue normal  ?NECK: supple, thyroid normal size, non-tender, without nodularity ?LYMPH:  no palpable lymphadenopathy in the cervical, axillary or inguinal ?LUNGS: clear to auscultation and percussion with normal breathing effort ?HEART: regular rate & rhythm and no murmurs and no lower extremity edema ?ABDOMEN:abdomen soft, non-tender and normal bowel sounds ?Musculoskeletal:no cyanosis of digits and no clubbing  ?NEURO: alert & oriented x 3 with fluent speech, no focal motor/sensory deficits ? ?LABORATORY  DATA:  ?I have reviewed the data as listed ?   ?Component Value Date/Time  ? NA 128 (A) 10/26/2021 0000  ? K 4.2 10/26/2021 0000  ? CL 93 (A) 10/26/2021 0000  ? CO2 27 (A) 10/26/2021 0000  ? GLUCOSE 94 09/07/2021 1348  ? BUN 15 10/26/2021 0000  ? CREATININE 0.7 10/26/2021 0000  ? CREATININE 0.98 09/07/2021 1348  ? CALCIUM 8.5 (A) 10/26/2021 0000  ? PROT 7.0 09/07/2021 1348  ? ALBUMIN 3.5 10/26/2021 0000  ? AST 22 10/26/2021 0000  ? AST 17 09/07/2021 1348  ? ALT 26 10/26/2021 0000  ? ALT 22 09/07/2021 1348  ? ALKPHOS 61 10/26/2021 0000  ? BILITOT 0.5 09/07/2021 1348  ? GFRNONAA >60 09/07/2021 1348  ? ? ?No results found for: SPEP, UPEP ? ?Lab Results  ?Component Value Date  ? WBC 3.3 10/26/2021  ? NEUTROABS 1.45 10/26/2021  ? HGB 12.0 (A) 10/26/2021  ? HCT 35 (A) 10/26/2021  ? MCV 90 10/19/2021  ? PLT 157 10/26/2021  ? ? ?  Chemistry   ?   ?Component Value Date/Time  ? NA 128 (A) 10/26/2021 0000  ? K 4.2 10/26/2021 0000  ? CL 93 (A) 10/26/2021 0000  ? CO2 27 (A)  10/26/2021 0000  ? BUN 15 10/26/2021 0000  ? CREATININE 0.7 10/26/2021 0000  ? CREATININE 0.98 09/07/2021 1348  ? GLU 113 10/26/2021 0000  ?    ?Component Value Date/Time  ? CALCIUM 8.5 (A) 10/26/2021 0000  ? A

## 2021-10-26 NOTE — Telephone Encounter (Signed)
Bringing patient in at 1:00 for labs and 1:30 to see Melissa.  We assess for fluids. ? ?

## 2021-10-26 NOTE — Assessment & Plan Note (Signed)
An 81 y.o. 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 He was placed on adjuvant carboplatin/paclitaxel chemotherapy.  He will receive a total of 4 cycles, with each cycle being repeated 3 weeks.  CT scans will be done afterwards to ascertain his new disease baseline.  If he remains disease-free, maintenance immunotherapy would also be considered. He received cycle 1 and presented last week for symptoms of nausea and vomiting. He was given IVF and zofran and felt much better. Over the weekend, he had several episodes of diarrhea and presents today with weakness. We will plan for more IVF. He will keep scheduled follow up with Dr. Bobby Rumpf. ?

## 2021-10-26 NOTE — Telephone Encounter (Signed)
-----   Message from Melodye Ped, NP sent at 10/26/2021  8:29 AM EDT ----- ?Regarding: RE: diarrhea ?Yes, he can get fluids. I guess we just need to figure out where. Let me see if they have room at [facility]. ? ?----- Message ----- ?From: Georgette Shell, RN ?Sent: 10/26/2021   8:27 AM EDT ?To: Melodye Ped, NP ?Subject: diarrhea                                      ? ? ?Faith Valls called stating patient has had server diarrhea over the weekend.  Michela Pitcher he is dizzy and slow.  Diarrhea resolved, wants to know if she should bring him in for fluids.  States he was dehydrated the last time. ? ? ?

## 2021-10-27 ENCOUNTER — Encounter: Payer: Self-pay | Admitting: Oncology

## 2021-10-27 ENCOUNTER — Encounter: Payer: Self-pay | Admitting: Hematology and Oncology

## 2021-10-30 ENCOUNTER — Telehealth: Payer: Self-pay

## 2021-10-30 NOTE — Telephone Encounter (Signed)
I spoke with pt's wife and notified her of below. I reminded her that with each cycle of treatment, his fatigue will increase (thus he will be slower to recover to baseline). He should continue to push po fluids and rest when he needs to. If he develops fever or any other symptoms that are worrisome, to call the center over the weekend. She verbalized understanding.  ? ? ?Dr Bobby Rumpf states pt is in his nadir period. He recommends they keep encouraging fluids and rest. If his systolic BP is under 902, he is told hold the lisonopril.  ? ? ?Pt's wife called to report that pt "just isn't bouncing back as quickly as before. He is dizzy and achy. I gave him some tylenol and he is using his cane". I asked if he had any vision changes. She replied, "You know he told me yesterday he was seeing a lot of yellow shades yesterday. Today, he told me he is seeing spots before his eyes". No N/V. No fevers. Eating and drinking well (he has had 6 bottles of Gatorade & water today).  I sent above message to Dr Bobby Rumpf and Gabriel Rung. ?

## 2021-11-09 ENCOUNTER — Inpatient Hospital Stay: Payer: Medicare Other | Admitting: Hematology and Oncology

## 2021-11-09 ENCOUNTER — Encounter: Payer: Self-pay | Admitting: Oncology

## 2021-11-09 ENCOUNTER — Other Ambulatory Visit: Payer: Self-pay | Admitting: Pharmacist

## 2021-11-09 ENCOUNTER — Inpatient Hospital Stay: Payer: Medicare Other

## 2021-11-09 ENCOUNTER — Other Ambulatory Visit: Payer: Self-pay

## 2021-11-09 ENCOUNTER — Encounter: Payer: Self-pay | Admitting: Hematology and Oncology

## 2021-11-09 DIAGNOSIS — C3492 Malignant neoplasm of unspecified part of left bronchus or lung: Secondary | ICD-10-CM

## 2021-11-09 LAB — HEPATIC FUNCTION PANEL
ALT: 23 U/L (ref 10–40)
AST: 21 (ref 14–40)
Alkaline Phosphatase: 80 (ref 25–125)
Bilirubin, Total: 0.4

## 2021-11-09 LAB — COMPREHENSIVE METABOLIC PANEL
Albumin: 4 (ref 3.5–5.0)
Calcium: 8.9 (ref 8.7–10.7)

## 2021-11-09 LAB — BASIC METABOLIC PANEL
BUN: 9 (ref 4–21)
CO2: 26 — AB (ref 13–22)
Chloride: 90 — AB (ref 99–108)
Creatinine: 0.9 (ref 0.6–1.3)
Glucose: 148
Potassium: 4.5 mEq/L (ref 3.5–5.1)
Sodium: 128 — AB (ref 137–147)

## 2021-11-09 LAB — CBC AND DIFFERENTIAL
HCT: 33 — AB (ref 41–53)
Hemoglobin: 11.2 — AB (ref 13.5–17.5)
Neutrophils Absolute: 1.37
Platelets: 325 10*3/uL (ref 150–400)
WBC: 3.7

## 2021-11-09 LAB — CBC: RBC: 3.73 — AB (ref 3.87–5.11)

## 2021-11-09 NOTE — Assessment & Plan Note (Signed)
An 81 y.o. 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 He was placed on adjuvant carboplatin/paclitaxel chemotherapy.  He will receive a total of 4 cycles, with each cycle being repeated 3 weeks.  CT scans will be done afterwards to ascertain his new disease baseline.  If he remains disease-free, maintenance immunotherapy would also be considered. He is due cycle 3 this week. ANC today is 1.37. We will plan to proceed with treatment and initiate pegfilgrastim with this cycle. We will also plan for IVF either Friday this week or Monday next week to hopefully prevent issue with dehydration. He will return to clinic in 3 weeks prior to cycle 4.  ?

## 2021-11-09 NOTE — Progress Notes (Cosign Needed)
?Patient Care Team: ?Derek Riches, NP as PCP - General ?Valrie Hart, RN as Oncology Nurse Navigator (Oncology) ?Marice Potter, MD as Consulting Physician (Oncology) ? ?Clinic Day:  11/09/2021 ? ?Referring physician: [name], NP ? ?ASSESSMENT & PLAN:  ? ?Assessment & Plan: ?Squamous cell carcinoma of lung, stage II, left ([place]) ?An 81 y.o. 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 He was placed on adjuvant carboplatin/paclitaxel chemotherapy.  He will receive a total of 4 cycles, with each cycle being repeated 3 weeks.  CT scans will be done afterwards to ascertain his new disease baseline.  If he remains disease-free, maintenance immunotherapy would also be considered. He is due cycle 3 this week. ANC today is 1.37. We will plan to proceed with treatment and initiate pegfilgrastim with this cycle. We will also plan for IVF either Friday this week or Monday next week to hopefully prevent issue with dehydration. He will return to clinic in 3 weeks prior to cycle 4.   ? ?The patient understands the plans discussed today and is in agreement with them.  He knows to contact our office if he develops concerns prior to his next appointment. ? ? ? ?Melodye Ped, NP  ?[facility] ?[place] ?[place] [address] ?Dept: [phone] ?Dept Fax: [phone]  ? ?No orders of the defined types were placed in this encounter. ?  ? ? ?CHIEF COMPLAINT:  ?CC: An 81 year old male with history of lung cancer here for 3 week follow up ? ?Current Treatment:  Carboplatin/ Paclitaxel ? ?INTERVAL HISTORY:  ?Derek Richards is here today for repeat clinical assessment. He denies fevers or chills. He denies pain. His appetite is good. His weight has been stable. ? ?I have reviewed the past medical history, past surgical history, social history and family history  with the patient and they are unchanged from previous note. ? ?ALLERGIES:  is allergic to chlorhexidine. ? ?MEDICATIONS:  ?Current Outpatient Medications  ?Medication Sig Dispense Refill  ? acetaminophen (TYLENOL) 500 MG tablet Take 500 mg by mouth every 6 (six) hours as needed. (Patient not taking: Reported on 10/13/2021)    ? aspirin 81 MG EC tablet Take 81 mg by mouth daily.    ? dexamethasone (DECADRON) 4 MG tablet Take 5 tabs at the night before and 5 tab the morning of chemotherapy, every 3 weeks, by mouth 60 tablet 0  ? diphenhydramine-acetaminophen (TYLENOL PM) 25-500 MG TABS tablet Take 1 tablet by mouth at bedtime as needed.    ? Lidocaine 4 % PTCH Apply 1 patch topically daily as needed (pain). (Patient not taking: Reported on 10/13/2021)    ? lisinopril (ZESTRIL) 10 MG tablet Take 10 mg by mouth daily.    ? Multiple Vitamin (MULTI-VITAMIN) tablet Take 1 tablet by mouth daily.    ? naproxen sodium (ALEVE) 220 MG tablet Take by mouth. (Patient not taking: Reported on 10/13/2021)    ? ondansetron (ZOFRAN) 4 MG tablet Take 1 tablet (4 mg total) by mouth every 4 (four) hours as needed for nausea. (Patient not taking: Reported on 10/13/2021) 90 tablet 3  ? pantoprazole (PROTONIX) 40 MG tablet Take 1 tablet by mouth daily.    ? prochlorperazine (COMPAZINE) 10 MG tablet Take 1 tablet (10 mg total) by mouth every 6 (six) hours as needed for nausea or vomiting. (  Patient not taking: Reported on 10/13/2021) 90 tablet 3  ? traMADol (ULTRAM) 50 MG tablet Take 1 tablet (50 mg total) by mouth every 6 (six) hours as needed for moderate pain (mild pain). 30 tablet 0  ? ?No current facility-administered medications for this visit.  ? ? ?HISTORY OF PRESENT ILLNESS:  ? ?Oncology History  ?Squamous cell carcinoma of lung, stage II, left ([place])  ?08/13/2021 Initial Diagnosis  ? Squamous cell carcinoma of lung, stage II, left ([place]) ? ?  ?09/07/2021 Cancer Staging  ? Staging form: Lung, AJCC 8th Edition ?- Clinical: Stage IIB (cT2a,  cN1, cM0) - Signed by Curt Bears, MD on 09/07/2021 ? ?  ?09/30/2021 -  Chemotherapy  ? Patient is on Treatment Plan : LUNG Carboplatin + Paclitaxel q21d  ? ?   ?  ? ? ?REVIEW OF SYSTEMS:  ? ?Constitutional: Denies fevers, chills or abnormal weight loss ?Eyes: Denies blurriness of vision ?Ears, nose, mouth, throat, and face: Denies mucositis or sore throat ?Respiratory: Denies cough, dyspnea or wheezes ?Cardiovascular: Denies palpitation, chest discomfort or lower extremity swelling ?Gastrointestinal:  Denies nausea, heartburn or change in bowel habits ?Skin: Denies abnormal skin rashes ?Lymphatics: Denies new lymphadenopathy or easy bruising ?Neurological:Denies numbness, tingling or new weaknesses ?Behavioral/Psych: Mood is stable, no new changes  ?All other systems were reviewed with the patient and are negative. ? ? ?VITALS:  ?Blood pressure (!) 104/59, pulse 90, temperature 97.8 ?F (36.6 ?C), temperature source Oral, resp. rate 18, height 5\' 8"  (1.727 m), weight 188 lb 8 oz (85.5 kg), SpO2 95 %.  ?Wt Readings from Last 3 Encounters:  ?11/09/21 188 lb 8 oz (85.5 kg)  ?10/26/21 189 lb 6.4 oz (85.9 kg)  ?10/21/21 191 lb 0.6 oz (86.7 kg)  ?  ?Body mass index is 28.66 kg/m?. ? ?Performance status (ECOG): 1 - Symptomatic but completely ambulatory ? ?PHYSICAL EXAM:  ? ?GENERAL:alert, no distress and comfortable ?SKIN: skin color, texture, turgor are normal, no rashes or significant lesions ?EYES: normal, Conjunctiva are pink and non-injected, sclera clear ?OROPHARYNX:no exudate, no erythema and lips, buccal mucosa, and tongue normal  ?NECK: supple, thyroid normal size, non-tender, without nodularity ?LYMPH:  no palpable lymphadenopathy in the cervical, axillary or inguinal ?LUNGS: clear to auscultation and percussion with normal breathing effort ?HEART: regular rate & rhythm and no murmurs and no lower extremity edema ?ABDOMEN:abdomen soft, non-tender and normal bowel sounds ?Musculoskeletal:no cyanosis of digits  and no clubbing  ?NEURO: alert & oriented x 3 with fluent speech, no focal motor/sensory deficits ? ?LABORATORY DATA:  ?I have reviewed the data as listed ?   ?Component Value Date/Time  ? NA 128 (A) 11/09/2021 0000  ? K 4.5 11/09/2021 0000  ? CL 90 (A) 11/09/2021 0000  ? CO2 26 (A) 11/09/2021 0000  ? GLUCOSE 94 09/07/2021 1348  ? BUN 9 11/09/2021 0000  ? CREATININE 0.9 11/09/2021 0000  ? CREATININE 0.98 09/07/2021 1348  ? CALCIUM 8.9 11/09/2021 0000  ? PROT 7.0 09/07/2021 1348  ? ALBUMIN 4.0 11/09/2021 0000  ? AST 21 11/09/2021 0000  ? AST 17 09/07/2021 1348  ? ALT 23 11/09/2021 0000  ? ALT 22 09/07/2021 1348  ? ALKPHOS 80 11/09/2021 0000  ? BILITOT 0.5 09/07/2021 1348  ? GFRNONAA >60 09/07/2021 1348  ? ? ?No results found for: SPEP, UPEP ? ?Lab Results  ?Component Value Date  ? WBC 3.7 11/09/2021  ? NEUTROABS 1.37 11/09/2021  ? HGB 11.2 (A) 11/09/2021  ? HCT 33 (A) 11/09/2021  ?  MCV 90 10/19/2021  ? PLT 325 11/09/2021  ? ? ?  Chemistry   ?   ?Component Value Date/Time  ? NA 128 (A) 11/09/2021 0000  ? K 4.5 11/09/2021 0000  ? CL 90 (A) 11/09/2021 0000  ? CO2 26 (A) 11/09/2021 0000  ? BUN 9 11/09/2021 0000  ? CREATININE 0.9 11/09/2021 0000  ? CREATININE 0.98 09/07/2021 1348  ? GLU 148 11/09/2021 0000  ?    ?Component Value Date/Time  ? CALCIUM 8.9 11/09/2021 0000  ? ALKPHOS 80 11/09/2021 0000  ? AST 21 11/09/2021 0000  ? AST 17 09/07/2021 1348  ? ALT 23 11/09/2021 0000  ? ALT 22 09/07/2021 1348  ? BILITOT 0.5 09/07/2021 1348  ?  ? ? ? ?RADIOGRAPHIC STUDIES: ?I have personally reviewed the radiological images as listed and agreed with the findings in the report. ?DG Chest 2 View ? ?Result Date: 10/13/2021 ?CLINICAL DATA:  Lung cancer post resection history of squamous cell carcinoma of the LEFT lung. EXAM: CHEST - 2 VIEW COMPARISON:  September 23, 2021. FINDINGS: LEFT hemidiaphragm is elevated following partial lung resection in the LEFT chest. LEFT-sided Port-A-Cath remains in place tip at the area of the caval to atrial  junction. Cardiomediastinal contours and hilar structures are stable. Lungs are clear aside from minimal scarring or atelectasis in the LEFT chest. No sign of pleural effusion. On limited assessment there is

## 2021-11-10 ENCOUNTER — Encounter: Payer: Self-pay | Admitting: Oncology

## 2021-11-10 MED FILL — Carboplatin IV Soln 600 MG/60ML: INTRAVENOUS | Qty: 57 | Status: AC

## 2021-11-10 MED FILL — Dexamethasone Sodium Phosphate Inj 100 MG/10ML: INTRAMUSCULAR | Qty: 1 | Status: AC

## 2021-11-10 MED FILL — Paclitaxel IV Conc 300 MG/50ML (6 MG/ML): INTRAVENOUS | Qty: 68 | Status: AC

## 2021-11-10 MED FILL — Fosaprepitant Dimeglumine For IV Infusion 150 MG (Base Eq): INTRAVENOUS | Qty: 5 | Status: AC

## 2021-11-11 ENCOUNTER — Inpatient Hospital Stay: Payer: Medicare Other

## 2021-11-11 VITALS — BP 144/74 | HR 100 | Resp 18 | Ht 68.0 in | Wt 191.8 lb

## 2021-11-11 DIAGNOSIS — C3492 Malignant neoplasm of unspecified part of left bronchus or lung: Secondary | ICD-10-CM

## 2021-11-11 DIAGNOSIS — Z5111 Encounter for antineoplastic chemotherapy: Secondary | ICD-10-CM | POA: Diagnosis not present

## 2021-11-11 MED ORDER — DIPHENHYDRAMINE HCL 50 MG/ML IJ SOLN
50.0000 mg | Freq: Once | INTRAMUSCULAR | Status: AC
  Administered 2021-11-11: 50 mg via INTRAVENOUS
  Filled 2021-11-11: qty 1

## 2021-11-11 MED ORDER — SODIUM CHLORIDE 0.9 % IV SOLN
200.0000 mg/m2 | Freq: Once | INTRAVENOUS | Status: AC
  Administered 2021-11-11: 408 mg via INTRAVENOUS
  Filled 2021-11-11: qty 68

## 2021-11-11 MED ORDER — HEPARIN SOD (PORK) LOCK FLUSH 100 UNIT/ML IV SOLN
500.0000 [IU] | Freq: Once | INTRAVENOUS | Status: AC | PRN
  Administered 2021-11-11: 500 [IU]

## 2021-11-11 MED ORDER — SODIUM CHLORIDE 0.9 % IV SOLN
570.0000 mg | Freq: Once | INTRAVENOUS | Status: AC
  Administered 2021-11-11: 570 mg via INTRAVENOUS
  Filled 2021-11-11: qty 57

## 2021-11-11 MED ORDER — SODIUM CHLORIDE 0.9 % IV SOLN: Freq: Once | INTRAVENOUS | Status: AC

## 2021-11-11 MED ORDER — SODIUM CHLORIDE 0.9% FLUSH
10.0000 mL | INTRAVENOUS | Status: DC | PRN
  Administered 2021-11-11: 10 mL

## 2021-11-11 MED ORDER — SODIUM CHLORIDE 0.9 % IV SOLN
150.0000 mg | Freq: Once | INTRAVENOUS | Status: AC
  Administered 2021-11-11: 150 mg via INTRAVENOUS
  Filled 2021-11-11: qty 150

## 2021-11-11 MED ORDER — SODIUM CHLORIDE 0.9 % IV SOLN
10.0000 mg | Freq: Once | INTRAVENOUS | Status: AC
  Administered 2021-11-11: 10 mg via INTRAVENOUS
  Filled 2021-11-11: qty 10

## 2021-11-11 MED ORDER — PALONOSETRON HCL INJECTION 0.25 MG/5ML
0.2500 mg | Freq: Once | INTRAVENOUS | Status: AC
  Administered 2021-11-11: 0.25 mg via INTRAVENOUS
  Filled 2021-11-11: qty 5

## 2021-11-11 MED ORDER — FAMOTIDINE IN NACL 20-0.9 MG/50ML-% IV SOLN
20.0000 mg | Freq: Once | INTRAVENOUS | Status: AC
  Administered 2021-11-11: 20 mg via INTRAVENOUS
  Filled 2021-11-11: qty 50

## 2021-11-11 NOTE — Progress Notes (Signed)
1558:PT STABLE AT TIME OF DISCHARGE ?

## 2021-11-11 NOTE — Patient Instructions (Addendum)
Derek Richards  Discharge Instructions: ?Thank you for choosing [facility] to provide your oncology and hematology care.  ?If you have a lab appointment with the [facility], please go directly to the [facility] and check in at the registration area. ?  ?Wear comfortable clothing and clothing appropriate for easy access to any Portacath or PICC line.  ? ?We strive to give you quality time with your provider. You may need to reschedule your appointment if you arrive late (15 or more minutes).  Arriving late affects you and other patients whose appointments are after yours.  Also, if you miss three or more appointments without notifying the office, you may be dismissed from the clinic at the provider?s discretion.    ?  ?For prescription refill requests, have your pharmacy contact our office and allow 72 hours for refills to be completed.   ? ?Today you received the following chemotherapy and/or immunotherapy agents Paclitaxel, Carboplatin    ?  ?To help prevent nausea and vomiting after your treatment, we encourage you to take your nausea medication as directed. ? ?BELOW ARE SYMPTOMS THAT SHOULD BE REPORTED IMMEDIATELY: ?*FEVER GREATER THAN 100.4 F (38 ?C) OR HIGHER ?*CHILLS OR SWEATING ?*NAUSEA AND VOMITING THAT IS NOT CONTROLLED WITH YOUR NAUSEA MEDICATION ?*UNUSUAL SHORTNESS OF BREATH ?*UNUSUAL BRUISING OR BLEEDING ?*URINARY PROBLEMS (pain or burning when urinating, or frequent urination) ?*BOWEL PROBLEMS (unusual diarrhea, constipation, pain near the anus) ?TENDERNESS IN MOUTH AND THROAT WITH OR WITHOUT PRESENCE OF ULCERS (sore throat, sores in mouth, or a toothache) ?UNUSUAL RASH, SWELLING OR PAIN  ?UNUSUAL VAGINAL DISCHARGE OR ITCHING  ? ?Items with * indicate a potential emergency and should be followed up as soon as possible or go to the Emergency Department if any problems should occur. ? ?Please show the CHEMOTHERAPY ALERT CARD or IMMUNOTHERAPY ALERT CARD at  check-in to the Emergency Department and triage nurse. ? ?Should you have questions after your visit or need to cancel or reschedule your appointment, please contact [facility]  Dept: [phone]  and follow the prompts.  Office hours are 8:00 a.m. to 4:30 p.m. Monday - Friday. Please note that voicemails left after 4:00 p.m. may not be returned until the following business day.  We are closed weekends and major holidays. You have access to a nurse at all times for urgent questions. Please call the main number to the clinic Dept: [phone] and follow the prompts. ? ?For any non-urgent questions, you may also contact your provider using MyChart. We now offer e-Visits for anyone 11 and older to request care online for non-urgent symptoms. For details visit [email or web address]. ?  ?Also download the MyChart app! Go to the app store, search "MyChart", open the app, select [facility], and log in with your MyChart username and password. ? ?Due to Covid, a mask is required upon entering the hospital/clinic. If you do not have a mask, one will be given to you upon arrival. For doctor visits, patients may have 1 support person aged 55 or older with them. For treatment visits, patients cannot have anyone with them due to current Covid guidelines and our immunocompromised population.  ? ?Pegfilgrastim Injection ?What is this medication? ?PEGFILGRASTIM (PEG fil gra stim) lowers the risk of infection in people who are receiving chemotherapy. It works by Building control surveyor make more white blood cells, which protects your body from infection. It may also be used to help people who have been  exposed to high doses of radiation. ?This medicine may be used for other purposes; ask your health care provider or pharmacist if you have questions. ?COMMON BRAND NAME(S): [place] [employer], Neulasta, Nyvepria, Stimufend, [place], Ziextenzo ?What should I tell my care team before I take this medication? ?They  need to know if you have any of these conditions: ?Kidney disease ?Latex allergy ?Ongoing radiation therapy ?Sickle cell disease ?Skin reactions to acrylic adhesives (On-Body Injector only) ?An unusual or allergic reaction to pegfilgrastim, filgrastim, other medications, foods, dyes, or preservatives ?Pregnant or trying to get pregnant ?Breast-feeding ?How should I use this medication? ?This medication is for injection under the skin. If you get this medication at home, you will be taught how to prepare and give the pre-filled syringe or how to use the On-body Injector. Refer to the patient Instructions for Use for detailed instructions. Use exactly as directed. Tell your care team immediately if you suspect that the On-body Injector may not have performed as intended or if you suspect the use of the On-body Injector resulted in a missed or partial dose. ?It is important that you put your used needles and syringes in a special sharps container. Do not put them in a trash can. If you do not have a sharps container, call your pharmacist or care team to get one. ?Talk to your care team about the use of this medication in children. While this medication may be prescribed for selected conditions, precautions do apply. ?Overdosage: If you think you have taken too much of this medicine contact a poison control center or emergency room at once. ?NOTE: This medicine is only for you. Do not share this medicine with others. ?What if I miss a dose? ?It is important not to miss your dose. Call your care team if you miss your dose. If you miss a dose due to an On-body Injector failure or leakage, a new dose should be administered as soon as possible using a single prefilled syringe for manual use. ?What may interact with this medication? ?Interactions have not been studied. ?This list may not describe all possible interactions. Give your health care provider a list of all the medicines, herbs, non-prescription drugs, or dietary  supplements you use. Also tell them if you smoke, drink alcohol, or use illegal drugs. Some items may interact with your medicine. ?What should I watch for while using this medication? ?Your condition will be monitored carefully while you are receiving this medication. ?You may need blood work done while you are taking this medication. ?Talk to your care team about your risk of cancer. You may be more at risk for certain types of cancer if you take this medication. ?If you are going to need a MRI, CT scan, or other procedure, tell your care team that you are using this medication (On-Body Injector only). ?What side effects may I notice from receiving this medication? ?Side effects that you should report to your care team as soon as possible: ?Allergic reactions--skin rash, itching, hives, swelling of the face, lips, tongue, or throat ?Capillary leak syndrome--stomach or muscle pain, unusual weakness or fatigue, feeling faint or lightheaded, decrease in the amount of urine, swelling of the ankles, hands, or feet, trouble breathing ?High white blood cell level--fever, fatigue, trouble breathing, night sweats, change in vision, weight loss ?Inflammation of the aorta--fever, fatigue, back, chest, or stomach pain, severe headache ?Kidney injury (glomerulonephritis)--decrease in the amount of urine, red or dark brown urine, foamy or bubbly urine, swelling of the ankles,  hands, or feet ?Shortness of breath or trouble breathing ?Spleen injury--pain in upper left stomach or shoulder ?Unusual bruising or bleeding ?Side effects that usually do not require medical attention (report to your care team if they continue or are bothersome): ?Bone pain ?Pain in the hands or feet ?This list may not describe all possible side effects. Call your doctor for medical advice about side effects. You may report side effects to FDA at 1-800-FDA-1088. ?Where should I keep my medication? ?Keep out of the reach of children. ?If you are using this  medication at home, you will be instructed on how to store it. Throw away any unused medication after the expiration date on the label. ?NOTE: This sheet is a summary. It may not cover all possible informa

## 2021-11-13 ENCOUNTER — Telehealth: Payer: Self-pay

## 2021-11-13 ENCOUNTER — Inpatient Hospital Stay: Payer: Medicare Other

## 2021-11-13 VITALS — BP 135/74 | HR 77 | Temp 97.8°F | Resp 18 | Ht 68.0 in | Wt 190.8 lb

## 2021-11-13 DIAGNOSIS — C3492 Malignant neoplasm of unspecified part of left bronchus or lung: Secondary | ICD-10-CM

## 2021-11-13 DIAGNOSIS — Z5111 Encounter for antineoplastic chemotherapy: Secondary | ICD-10-CM | POA: Diagnosis not present

## 2021-11-13 MED ORDER — PEGFILGRASTIM-BMEZ 6 MG/0.6ML ~~LOC~~ SOSY
6.0000 mg | PREFILLED_SYRINGE | Freq: Once | SUBCUTANEOUS | Status: AC
  Administered 2021-11-13: 6 mg via SUBCUTANEOUS
  Filled 2021-11-13: qty 0.6

## 2021-11-13 NOTE — Telephone Encounter (Signed)
Letta Median, pt's wife, called to ask if Trevin would get IVF today when he comes for his Neulasta) to get him through the weekend? After last treatment, he had a lot of diarrhea and he really felt bad. I asked if they had Imodium in the home? She replied yes and she gave him that. I reeducated her to give him up to 8 tabs Imodium per day if needed to control diarrhea. She states he drinks a lot everyday. He drinks 5-6 bottles of water, then 3-4 bottles of Gatorade/coffee, etc. He is urinating well also. He will be getting his first Neulasta injection today, as his WBC were borderline this time to get treatment. She has already given him a Claritin this morning. She asked if there is anything else she can do if he has any bone pain. I told her she could treat him w/tylenol PRN.

## 2021-11-13 NOTE — Patient Instructions (Signed)

## 2021-11-16 ENCOUNTER — Telehealth: Payer: Self-pay

## 2021-11-16 ENCOUNTER — Other Ambulatory Visit: Payer: Self-pay | Admitting: Hematology and Oncology

## 2021-11-16 MED ORDER — TRAMADOL HCL 50 MG PO TABS: 50.0000 mg | ORAL_TABLET | Freq: Four times a day (QID) | ORAL | 0 refills | Status: DC | PRN

## 2021-11-16 NOTE — Telephone Encounter (Signed)
bone pain Received: Today Georgette Shell, RN  Melodye Ped, NP Letta Median, pt spouse called stating pt is have worsen pain from neulasta injection.  He is taking his claritin and tylenol but the pain has only minimally improved.   Almira Bar, NP sent in tramadol prescription.  Spouse aware.

## 2021-11-29 NOTE — Progress Notes (Deleted)
Patient Care Team: [name], NP as PCP - General Valrie Hart, RN as Oncology Nurse Navigator (Oncology) Marice Potter, MD as Consulting Physician (Oncology)  Clinic Day:  11/29/2021  Referring physician: [name], NP  ASSESSMENT & PLAN:   Assessment & Plan: No problem-specific Assessment & Plan notes found for this encounter.    The patient understands the plans discussed today and is in agreement with them.  He knows to contact our office if he develops concerns prior to his next appointment.    Marice Potter, MD  [facility] [address] [place] [address] Dept: [phone] Dept Fax: [phone]   No orders of the defined types were placed in this encounter.     CHIEF COMPLAINT:  CC: An 81 year old male with history of lung cancer here for 3 week follow up  Current Treatment:  Carboplatin/ Paclitaxel  INTERVAL HISTORY:  Marquiz is here today for repeat clinical assessment. He denies fevers or chills. He denies pain. His appetite is good. His weight has been stable.  I have reviewed the past medical history, past surgical history, social history and family history with the patient and they are unchanged from previous note.  ALLERGIES:  is allergic to chlorhexidine.  MEDICATIONS:  Current Outpatient Medications  Medication Sig Dispense Refill   acetaminophen (TYLENOL) 500 MG tablet Take 500 mg by mouth every 6 (six) hours as needed. (Patient not taking: Reported on 10/13/2021)     aspirin 81 MG EC tablet Take 81 mg by mouth daily.     dexamethasone (DECADRON) 4 MG tablet Take 5 tabs at the night before and 5 tab the morning of chemotherapy, every 3 weeks, by mouth 60 tablet 0   diphenhydramine-acetaminophen (TYLENOL PM) 25-500 MG TABS tablet Take 1 tablet by mouth at bedtime as needed.     Lidocaine 4 % PTCH Apply 1 patch topically daily as needed (pain). (Patient not taking:  Reported on 10/13/2021)     lisinopril (ZESTRIL) 10 MG tablet Take 10 mg by mouth daily.     Multiple Vitamin (MULTI-VITAMIN) tablet Take 1 tablet by mouth daily.     naproxen sodium (ALEVE) 220 MG tablet Take by mouth. (Patient not taking: Reported on 10/13/2021)     ondansetron (ZOFRAN) 4 MG tablet Take 1 tablet (4 mg total) by mouth every 4 (four) hours as needed for nausea. (Patient not taking: Reported on 10/13/2021) 90 tablet 3   pantoprazole (PROTONIX) 40 MG tablet Take 1 tablet by mouth daily.     prochlorperazine (COMPAZINE) 10 MG tablet Take 1 tablet (10 mg total) by mouth every 6 (six) hours as needed for nausea or vomiting. (Patient not taking: Reported on 10/13/2021) 90 tablet 3   traMADol (ULTRAM) 50 MG tablet Take 1-2 tablets (50-100 mg total) by mouth every 6 (six) hours as needed for moderate pain (mild pain). 90 tablet 0   No current facility-administered medications for this visit.    HISTORY OF PRESENT ILLNESS:   Oncology History  Squamous cell carcinoma of lung, stage II, left ([facility])  08/13/2021 Initial Diagnosis   Squamous cell carcinoma of lung, stage II, left ([facility])    09/07/2021 Cancer Staging   Staging form: Lung, AJCC 8th Edition - Clinical: Stage IIB (cT2a, cN1, cM0) - Signed by Curt Bears, MD on 09/07/2021    09/30/2021 -  Chemotherapy   Patient is on Treatment Plan : LUNG Carboplatin + Paclitaxel  q21d          REVIEW OF SYSTEMS:   Constitutional: Denies fevers, chills or abnormal weight loss Eyes: Denies blurriness of vision Ears, nose, mouth, throat, and face: Denies mucositis or sore throat Respiratory: Denies cough, dyspnea or wheezes Cardiovascular: Denies palpitation, chest discomfort or lower extremity swelling Gastrointestinal:  Denies nausea, heartburn or change in bowel habits Skin: Denies abnormal skin rashes Lymphatics: Denies new lymphadenopathy or easy bruising Neurological:Denies numbness, tingling or new weaknesses Behavioral/Psych:  Mood is stable, no new changes  All other systems were reviewed with the patient and are negative.   VITALS:  There were no vitals taken for this visit.  Wt Readings from Last 3 Encounters:  11/13/21 190 lb 12 oz (86.5 kg)  11/11/21 191 lb 12 oz (87 kg)  11/09/21 188 lb 8 oz (85.5 kg)    There is no height or weight on file to calculate BMI.  Performance status (ECOG): 1 - Symptomatic but completely ambulatory  PHYSICAL EXAM:   GENERAL:alert, no distress and comfortable SKIN: skin color, texture, turgor are normal, no rashes or significant lesions EYES: normal, Conjunctiva are pink and non-injected, sclera clear OROPHARYNX:no exudate, no erythema and lips, buccal mucosa, and tongue normal  NECK: supple, thyroid normal size, non-tender, without nodularity LYMPH:  no palpable lymphadenopathy in the cervical, axillary or inguinal LUNGS: clear to auscultation and percussion with normal breathing effort HEART: regular rate & rhythm and no murmurs and no lower extremity edema ABDOMEN:abdomen soft, non-tender and normal bowel sounds Musculoskeletal:no cyanosis of digits and no clubbing  NEURO: alert & oriented x 3 with fluent speech, no focal motor/sensory deficits  LABORATORY DATA:  I have reviewed the data as listed    Component Value Date/Time   NA 128 (A) 11/09/2021 0000   K 4.5 11/09/2021 0000   CL 90 (A) 11/09/2021 0000   CO2 26 (A) 11/09/2021 0000   GLUCOSE 94 09/07/2021 1348   BUN 9 11/09/2021 0000   CREATININE 0.9 11/09/2021 0000   CREATININE 0.98 09/07/2021 1348   CALCIUM 8.9 11/09/2021 0000   PROT 7.0 09/07/2021 1348   ALBUMIN 4.0 11/09/2021 0000   AST 21 11/09/2021 0000   AST 17 09/07/2021 1348   ALT 23 11/09/2021 0000   ALT 22 09/07/2021 1348   ALKPHOS 80 11/09/2021 0000   BILITOT 0.5 09/07/2021 1348   GFRNONAA >60 09/07/2021 1348    No results found for: SPEP, UPEP  Lab Results  Component Value Date   WBC 3.7 11/09/2021   NEUTROABS 1.37 11/09/2021    HGB 11.2 (A) 11/09/2021   HCT 33 (A) 11/09/2021   MCV 90 10/19/2021   PLT 325 11/09/2021      Chemistry      Component Value Date/Time   NA 128 (A) 11/09/2021 0000   K 4.5 11/09/2021 0000   CL 90 (A) 11/09/2021 0000   CO2 26 (A) 11/09/2021 0000   BUN 9 11/09/2021 0000   CREATININE 0.9 11/09/2021 0000   CREATININE 0.98 09/07/2021 1348   GLU 148 11/09/2021 0000      Component Value Date/Time   CALCIUM 8.9 11/09/2021 0000   ALKPHOS 80 11/09/2021 0000   AST 21 11/09/2021 0000   AST 17 09/07/2021 1348   ALT 23 11/09/2021 0000   ALT 22 09/07/2021 1348   BILITOT 0.5 09/07/2021 1348       RADIOGRAPHIC STUDIES: I have personally reviewed the radiological images as listed and agreed with the findings in the report. No  results found.

## 2021-11-29 NOTE — Progress Notes (Signed)
[facility]  [address] [place],  [place]  [address] [phone]  Clinic Day:  11/30/2021  Referring physician: [name], NP  HISTORY OF PRESENT ILLNESS:  The patient is an 81 y.o. male with stage IIB (T2a N1 M0) squamous cell lung cancer., status post a left upper lobectomy in February 2023.  He comes in today to be evaluated before heading into his 4th and final cycle of adjuvant carboplatin/paclitaxel chemotherapy.  He claims to tolerated his third cycle of chemotherapy okay.  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  Furthermore, he claims to have had more urinary retention with each successive cycle of treatment.  As it pertains to his lung cancer, he denies having any new symptoms or findings which concern him for early disease recurrence.  PHYSICAL EXAM:  Blood pressure 130/63, pulse 97, temperature 98.4 F (36.9 C), resp. rate 18, height 5\' 8"  (1.727 m), weight 189 lb 3.2 oz (85.8 kg), SpO2 97 %. Wt Readings from Last 3 Encounters:  11/30/21 189 lb 3.2 oz (85.8 kg)  11/13/21 190 lb 12 oz (86.5 kg)  11/11/21 191 lb 12 oz (87 kg)   Body mass index is 28.77 kg/m. Performance status (ECOG): 1 - Symptomatic but completely ambulatory Physical Exam Constitutional:      Appearance: Normal appearance. He is not ill-appearing.  HENT:     Mouth/Throat:     Mouth: Mucous membranes are moist.     Pharynx: Oropharynx is clear. No oropharyngeal exudate or posterior oropharyngeal erythema.  Cardiovascular:     Rate and Rhythm: Normal rate and regular rhythm.     Heart sounds: No murmur heard.   No friction rub. No gallop.  Pulmonary:     Effort: Pulmonary effort is normal. No respiratory distress.     Breath sounds: Normal breath sounds. No wheezing, rhonchi or rales.  Abdominal:     General: Bowel sounds are normal. There is no distension.     Palpations: Abdomen is soft. There is no mass.     Tenderness:  There is no abdominal tenderness.  Musculoskeletal:        General: No swelling.     Right lower leg: No edema.     Left lower leg: No edema.  Lymphadenopathy:     Cervical: No cervical adenopathy.     Upper Body:     Right upper body: No supraclavicular or axillary adenopathy.     Left upper body: No supraclavicular or axillary adenopathy.     Lower Body: No right inguinal adenopathy. No left inguinal adenopathy.  Skin:    General: Skin is warm.     Coloration: Skin is not jaundiced.     Findings: No lesion or rash.  Neurological:     General: No focal deficit present.     Mental Status: He is alert and oriented to person, place, and time. Mental status is at baseline.  Psychiatric:        Mood and Affect: Mood normal.        Behavior: Behavior normal.        Thought Content: Thought content normal.   LABS:      Latest Ref Rng & Units 11/30/2021   12:00 AM 11/09/2021   12:00 AM 10/26/2021   12:00 AM  CBC  WBC  5.1      3.7      3.3       Hemoglobin 13.5 -  17.5 10.1      11.2      12.0       Hematocrit 41 - 53 29      33      35       Platelets 150 - 400 K/uL 286      325      157          This result is from an external source.      Latest Ref Rng & Units 11/30/2021   12:00 AM 11/09/2021   12:00 AM 10/26/2021   12:00 AM  CMP  BUN 4 - 21 13      9      15        Creatinine 0.6 - 1.3 0.8      0.9      0.7       Sodium 137 - 147 129      128      128       Potassium 3.5 - 5.1 mEq/L 4.7      4.5      4.2       Chloride 99 - 108 90      90      93       CO2 13 - 22 27      26      27        Calcium 8.7 - 10.7 8.8      8.9      8.5       Alkaline Phos 25 - 125 86      80      61       AST 14 - 40 21      21      22        ALT 10 - 40 U/L 22      23      26           This result is from an external source.   ASSESSMENT & PLAN:  An 81 y.o. 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  He will proceed with his  fourth and final cycle of adjuvant carboplatin/paclitaxel this week.  Due to some of the side effects he is having from his chemotherapy, the doses with his final cycle of chemotherapy will be decreased by 20%.  Overall, the patient appears to be doing fairly well.  I will see him back in 6 weeks for repeat clinical assessment.  A chest CT will be done a day before his next visit to ascertain his new disease baseline after completion of all of his adjuvant chemotherapy.  The patient understands all the plans discussed today and is in agreement with them.  Orazio Weller Macarthur Critchley, MD

## 2021-11-30 ENCOUNTER — Inpatient Hospital Stay: Payer: Medicare Other | Attending: Internal Medicine

## 2021-11-30 ENCOUNTER — Encounter: Payer: Self-pay | Admitting: Oncology

## 2021-11-30 ENCOUNTER — Inpatient Hospital Stay: Payer: Medicare Other | Admitting: Oncology

## 2021-11-30 ENCOUNTER — Other Ambulatory Visit: Payer: Self-pay | Admitting: Oncology

## 2021-11-30 VITALS — BP 130/63 | HR 97 | Temp 98.4°F | Resp 18 | Ht 68.0 in | Wt 189.2 lb

## 2021-11-30 DIAGNOSIS — C3492 Malignant neoplasm of unspecified part of left bronchus or lung: Secondary | ICD-10-CM

## 2021-11-30 DIAGNOSIS — G629 Polyneuropathy, unspecified: Secondary | ICD-10-CM | POA: Insufficient documentation

## 2021-11-30 DIAGNOSIS — C3412 Malignant neoplasm of upper lobe, left bronchus or lung: Secondary | ICD-10-CM | POA: Insufficient documentation

## 2021-11-30 DIAGNOSIS — Z5111 Encounter for antineoplastic chemotherapy: Secondary | ICD-10-CM | POA: Insufficient documentation

## 2021-11-30 LAB — BASIC METABOLIC PANEL
BUN: 13 (ref 4–21)
CO2: 27 — AB (ref 13–22)
Chloride: 90 — AB (ref 99–108)
Creatinine: 0.8 (ref 0.6–1.3)
Glucose: 120
Potassium: 4.7 mEq/L (ref 3.5–5.1)
Sodium: 129 — AB (ref 137–147)

## 2021-11-30 LAB — CBC AND DIFFERENTIAL
HCT: 29 — AB (ref 41–53)
Hemoglobin: 10.1 — AB (ref 13.5–17.5)
Neutrophils Absolute: 2.65
Platelets: 286 10*3/uL (ref 150–400)
WBC: 5.1

## 2021-11-30 LAB — COMPREHENSIVE METABOLIC PANEL
Albumin: 4.1 (ref 3.5–5.0)
Calcium: 8.8 (ref 8.7–10.7)

## 2021-11-30 LAB — HEPATIC FUNCTION PANEL
ALT: 22 U/L (ref 10–40)
AST: 21 (ref 14–40)
Alkaline Phosphatase: 86 (ref 25–125)
Bilirubin, Total: 0.5

## 2021-11-30 LAB — CBC: RBC: 3.24 — AB (ref 3.87–5.11)

## 2021-11-30 NOTE — Progress Notes (Signed)
Reduce doses of chemo by 20% due to poor tolerance per Dr. Bobby Rumpf.

## 2021-12-01 MED FILL — Dexamethasone Sodium Phosphate Inj 100 MG/10ML: INTRAMUSCULAR | Qty: 1 | Status: AC

## 2021-12-01 MED FILL — Fosaprepitant Dimeglumine For IV Infusion 150 MG (Base Eq): INTRAVENOUS | Qty: 5 | Status: AC

## 2021-12-01 MED FILL — Paclitaxel IV Conc 300 MG/50ML (6 MG/ML): INTRAVENOUS | Qty: 55 | Status: AC

## 2021-12-01 MED FILL — Carboplatin IV Soln 600 MG/60ML: INTRAVENOUS | Qty: 46 | Status: AC

## 2021-12-02 ENCOUNTER — Inpatient Hospital Stay: Payer: Medicare Other

## 2021-12-02 VITALS — BP 145/89 | HR 98 | Temp 98.2°F | Resp 18 | Wt 189.0 lb

## 2021-12-02 DIAGNOSIS — C3492 Malignant neoplasm of unspecified part of left bronchus or lung: Secondary | ICD-10-CM

## 2021-12-02 DIAGNOSIS — C3412 Malignant neoplasm of upper lobe, left bronchus or lung: Secondary | ICD-10-CM | POA: Diagnosis present

## 2021-12-02 DIAGNOSIS — Z5111 Encounter for antineoplastic chemotherapy: Secondary | ICD-10-CM | POA: Diagnosis present

## 2021-12-02 DIAGNOSIS — G629 Polyneuropathy, unspecified: Secondary | ICD-10-CM | POA: Diagnosis not present

## 2021-12-02 MED ORDER — SODIUM CHLORIDE 0.9 % IV SOLN
160.0000 mg/m2 | Freq: Once | INTRAVENOUS | Status: AC
  Administered 2021-12-02: 330 mg via INTRAVENOUS
  Filled 2021-12-02: qty 55

## 2021-12-02 MED ORDER — DIPHENHYDRAMINE HCL 50 MG/ML IJ SOLN
50.0000 mg | Freq: Once | INTRAMUSCULAR | Status: AC
  Administered 2021-12-02: 50 mg via INTRAVENOUS
  Filled 2021-12-02: qty 1

## 2021-12-02 MED ORDER — SODIUM CHLORIDE 0.9 % IV SOLN
150.0000 mg | Freq: Once | INTRAVENOUS | Status: AC
  Administered 2021-12-02: 150 mg via INTRAVENOUS
  Filled 2021-12-02: qty 150

## 2021-12-02 MED ORDER — PALONOSETRON HCL INJECTION 0.25 MG/5ML
0.2500 mg | Freq: Once | INTRAVENOUS | Status: AC
  Administered 2021-12-02: 0.25 mg via INTRAVENOUS
  Filled 2021-12-02: qty 5

## 2021-12-02 MED ORDER — SODIUM CHLORIDE 0.9 % IV SOLN
459.1900 mg | Freq: Once | INTRAVENOUS | Status: AC
  Administered 2021-12-02: 460 mg via INTRAVENOUS
  Filled 2021-12-02: qty 46

## 2021-12-02 MED ORDER — SODIUM CHLORIDE 0.9 % IV SOLN: Freq: Once | INTRAVENOUS | Status: AC

## 2021-12-02 MED ORDER — SODIUM CHLORIDE 0.9 % IV SOLN
10.0000 mg | Freq: Once | INTRAVENOUS | Status: AC
  Administered 2021-12-02: 10 mg via INTRAVENOUS
  Filled 2021-12-02: qty 10

## 2021-12-02 MED ORDER — FAMOTIDINE IN NACL 20-0.9 MG/50ML-% IV SOLN
20.0000 mg | Freq: Once | INTRAVENOUS | Status: AC
  Administered 2021-12-02: 20 mg via INTRAVENOUS
  Filled 2021-12-02: qty 50

## 2021-12-02 NOTE — Patient Instructions (Signed)
Carboplatin injection What is this medication? CARBOPLATIN (KAR boe pla tin) is a chemotherapy drug. It targets fast dividing cells, like cancer cells, and causes these cells to die. This medicine is used to treat ovarian cancer and many other cancers. This medicine may be used for other purposes; ask your health care provider or pharmacist if you have questions. COMMON BRAND NAME(S): Paraplatin What should I tell my care team before I take this medication? They need to know if you have any of these conditions: blood disorders hearing problems kidney disease recent or ongoing radiation therapy an unusual or allergic reaction to carboplatin, cisplatin, other chemotherapy, other medicines, foods, dyes, or preservatives pregnant or trying to get pregnant breast-feeding How should I use this medication? This drug is usually given as an infusion into a vein. It is administered in a hospital or clinic by a specially trained health care professional. Talk to your pediatrician regarding the use of this medicine in children. Special care may be needed. Overdosage: If you think you have taken too much of this medicine contact a poison control center or emergency room at once. NOTE: This medicine is only for you. Do not share this medicine with others. What if I miss a dose? It is important not to miss a dose. Call your doctor or health care professional if you are unable to keep an appointment. What may interact with this medication? medicines for seizures medicines to increase blood counts like filgrastim, pegfilgrastim, sargramostim some antibiotics like amikacin, gentamicin, neomycin, streptomycin, tobramycin vaccines Talk to your doctor or health care professional before taking any of these medicines: acetaminophen aspirin ibuprofen ketoprofen naproxen This list may not describe all possible interactions. Give your health care provider a list of all the medicines, herbs, non-prescription  drugs, or dietary supplements you use. Also tell them if you smoke, drink alcohol, or use illegal drugs. Some items may interact with your medicine. What should I watch for while using this medication? Your condition will be monitored carefully while you are receiving this medicine. You will need important blood work done while you are taking this medicine. This drug may make you feel generally unwell. This is not uncommon, as chemotherapy can affect healthy cells as well as cancer cells. Report any side effects. Continue your course of treatment even though you feel ill unless your doctor tells you to stop. In some cases, you may be given additional medicines to help with side effects. Follow all directions for their use. Call your doctor or health care professional for advice if you get a fever, chills or sore throat, or other symptoms of a cold or flu. Do not treat yourself. This drug decreases your body's ability to fight infections. Try to avoid being around people who are sick. This medicine may increase your risk to bruise or bleed. Call your doctor or health care professional if you notice any unusual bleeding. Be careful brushing and flossing your teeth or using a toothpick because you may get an infection or bleed more easily. If you have any dental work done, tell your dentist you are receiving this medicine. Avoid taking products that contain aspirin, acetaminophen, ibuprofen, naproxen, or ketoprofen unless instructed by your doctor. These medicines may hide a fever. Do not become pregnant while taking this medicine. Women should inform their doctor if they wish to become pregnant or think they might be pregnant. There is a potential for serious side effects to an unborn child. Talk to your health care professional or pharmacist  for more information. Do not breast-feed an infant while taking this medicine. What side effects may I notice from receiving this medication? Side effects that you  should report to your doctor or health care professional as soon as possible: allergic reactions like skin rash, itching or hives, swelling of the face, lips, or tongue signs of infection - fever or chills, cough, sore throat, pain or difficulty passing urine signs of decreased platelets or bleeding - bruising, pinpoint red spots on the skin, black, tarry stools, nosebleeds signs of decreased red blood cells - unusually weak or tired, fainting spells, lightheadedness breathing problems changes in hearing changes in vision chest pain high blood pressure low blood counts - This drug may decrease the number of white blood cells, red blood cells and platelets. You may be at increased risk for infections and bleeding. nausea and vomiting pain, swelling, redness or irritation at the injection site pain, tingling, numbness in the hands or feet problems with balance, talking, walking trouble passing urine or change in the amount of urine Side effects that usually do not require medical attention (report to your doctor or health care professional if they continue or are bothersome): hair loss loss of appetite metallic taste in the mouth or changes in taste This list may not describe all possible side effects. Call your doctor for medical advice about side effects. You may report side effects to FDA at 1-800-FDA-1088. Where should I keep my medication? This drug is given in a hospital or clinic and will not be stored at home. NOTE: This sheet is a summary. It may not cover all possible information. If you have questions about this medicine, talk to your doctor, pharmacist, or health care provider.  2023 Elsevier/Gold Standard (2007-11-22 00:00:00) Paclitaxel injection What is this medication? PACLITAXEL (PAK li TAX el) is a chemotherapy drug. It targets fast dividing cells, like cancer cells, and causes these cells to die. This medicine is used to treat ovarian cancer, breast cancer, lung cancer,  Kaposi's sarcoma, and other cancers. This medicine may be used for other purposes; ask your health care provider or pharmacist if you have questions. COMMON BRAND NAME(S): Onxol, Taxol What should I tell my care team before I take this medication? They need to know if you have any of these conditions: history of irregular heartbeat liver disease low blood counts, like low white cell, platelet, or red cell counts lung or breathing disease, like asthma tingling of the fingers or toes, or other nerve disorder an unusual or allergic reaction to paclitaxel, alcohol, polyoxyethylated castor oil, other chemotherapy, other medicines, foods, dyes, or preservatives pregnant or trying to get pregnant breast-feeding How should I use this medication? This drug is given as an infusion into a vein. It is administered in a hospital or clinic by a specially trained health care professional. Talk to your pediatrician regarding the use of this medicine in children. Special care may be needed. Overdosage: If you think you have taken too much of this medicine contact a poison control center or emergency room at once. NOTE: This medicine is only for you. Do not share this medicine with others. What if I miss a dose? It is important not to miss your dose. Call your doctor or health care professional if you are unable to keep an appointment. What may interact with this medication? Do not take this medicine with any of the following medications: live virus vaccines This medicine may also interact with the following medications: antiviral medicines for hepatitis,  HIV or AIDS certain antibiotics like erythromycin and clarithromycin certain medicines for fungal infections like ketoconazole and itraconazole certain medicines for seizures like carbamazepine, phenobarbital, phenytoin gemfibrozil nefazodone rifampin St. John's wort This list may not describe all possible interactions. Give your health care provider  a list of all the medicines, herbs, non-prescription drugs, or dietary supplements you use. Also tell them if you smoke, drink alcohol, or use illegal drugs. Some items may interact with your medicine. What should I watch for while using this medication? Your condition will be monitored carefully while you are receiving this medicine. You will need important blood work done while you are taking this medicine. This medicine can cause serious allergic reactions. To reduce your risk you will need to take other medicine(s) before treatment with this medicine. If you experience allergic reactions like skin rash, itching or hives, swelling of the face, lips, or tongue, tell your doctor or health care professional right away. In some cases, you may be given additional medicines to help with side effects. Follow all directions for their use. This drug may make you feel generally unwell. This is not uncommon, as chemotherapy can affect healthy cells as well as cancer cells. Report any side effects. Continue your course of treatment even though you feel ill unless your doctor tells you to stop. Call your doctor or health care professional for advice if you get a fever, chills or sore throat, or other symptoms of a cold or flu. Do not treat yourself. This drug decreases your body's ability to fight infections. Try to avoid being around people who are sick. This medicine may increase your risk to bruise or bleed. Call your doctor or health care professional if you notice any unusual bleeding. Be careful brushing and flossing your teeth or using a toothpick because you may get an infection or bleed more easily. If you have any dental work done, tell your dentist you are receiving this medicine. Avoid taking products that contain aspirin, acetaminophen, ibuprofen, naproxen, or ketoprofen unless instructed by your doctor. These medicines may hide a fever. Do not become pregnant while taking this medicine. Women should  inform their doctor if they wish to become pregnant or think they might be pregnant. There is a potential for serious side effects to an unborn child. Talk to your health care professional or pharmacist for more information. Do not breast-feed an infant while taking this medicine. Men are advised not to father a child while receiving this medicine. This product may contain alcohol. Ask your pharmacist or healthcare provider if this medicine contains alcohol. Be sure to tell all healthcare providers you are taking this medicine. Certain medicines, like metronidazole and disulfiram, can cause an unpleasant reaction when taken with alcohol. The reaction includes flushing, headache, nausea, vomiting, sweating, and increased thirst. The reaction can last from 30 minutes to several hours. What side effects may I notice from receiving this medication? Side effects that you should report to your doctor or health care professional as soon as possible: allergic reactions like skin rash, itching or hives, swelling of the face, lips, or tongue breathing problems changes in vision fast, irregular heartbeat high or low blood pressure mouth sores pain, tingling, numbness in the hands or feet signs of decreased platelets or bleeding - bruising, pinpoint red spots on the skin, black, tarry stools, blood in the urine signs of decreased red blood cells - unusually weak or tired, feeling faint or lightheaded, falls signs of infection - fever or chills, cough,  sore throat, pain or difficulty passing urine signs and symptoms of liver injury like dark yellow or brown urine; general ill feeling or flu-like symptoms; light-colored stools; loss of appetite; nausea; right upper belly pain; unusually weak or tired; yellowing of the eyes or skin swelling of the ankles, feet, hands unusually slow heartbeat Side effects that usually do not require medical attention (report to your doctor or health care professional if they  continue or are bothersome): diarrhea hair loss loss of appetite muscle or joint pain nausea, vomiting pain, redness, or irritation at site where injected tiredness This list may not describe all possible side effects. Call your doctor for medical advice about side effects. You may report side effects to FDA at 1-800-FDA-1088. Where should I keep my medication? This drug is given in a hospital or clinic and will not be stored at home. NOTE: This sheet is a summary. It may not cover all possible information. If you have questions about this medicine, talk to your doctor, pharmacist, or health care provider.  2023 Elsevier/Gold Standard (2021-05-15 00:00:00)

## 2021-12-04 ENCOUNTER — Ambulatory Visit: Payer: Medicare Other

## 2021-12-07 ENCOUNTER — Inpatient Hospital Stay: Payer: Medicare Other

## 2021-12-07 ENCOUNTER — Telehealth: Payer: Self-pay

## 2021-12-07 ENCOUNTER — Encounter: Payer: Self-pay | Admitting: Hematology and Oncology

## 2021-12-07 VITALS — BP 145/70 | HR 89 | Temp 97.5°F | Resp 18

## 2021-12-07 DIAGNOSIS — R112 Nausea with vomiting, unspecified: Secondary | ICD-10-CM

## 2021-12-07 DIAGNOSIS — E860 Dehydration: Secondary | ICD-10-CM

## 2021-12-07 DIAGNOSIS — Z5111 Encounter for antineoplastic chemotherapy: Secondary | ICD-10-CM | POA: Diagnosis not present

## 2021-12-07 MED ORDER — HEPARIN SOD (PORK) LOCK FLUSH 100 UNIT/ML IV SOLN
250.0000 [IU] | Freq: Once | INTRAVENOUS | Status: AC | PRN
  Administered 2021-12-07: 500 [IU]

## 2021-12-07 MED ORDER — SODIUM CHLORIDE 0.9% FLUSH
3.0000 mL | Freq: Once | INTRAVENOUS | Status: AC | PRN
  Administered 2021-12-07: 10 mL

## 2021-12-07 MED ORDER — SODIUM CHLORIDE 0.9 % IV SOLN: Freq: Once | INTRAVENOUS | Status: AC

## 2021-12-07 NOTE — Patient Instructions (Signed)
Dehydration, Adult Dehydration is condition in which there is not enough water or other fluids in the body. This happens when a person loses more fluids than he or she takes in. Important body parts cannot work right without the right amount of fluids. Any loss of fluids from the body can cause dehydration. Dehydration can be mild, worse, or very bad. It should be treated right away to keep it from getting very bad. What are the causes? This condition may be caused by: Conditions that cause loss of water or other fluids, such as: Watery poop (diarrhea). Vomiting. Sweating a lot. Peeing (urinating) a lot. Not drinking enough fluids, especially when you: Are ill. Are doing things that take a lot of energy to do. Other illnesses and conditions, such as fever or infection. Certain medicines, such as medicines that take extra fluid out of the body (diuretics). Lack of safe drinking water. Not being able to get enough water and food. What increases the risk? The following factors may make you more likely to develop this condition: Having a long-term (chronic) illness that has not been treated the right way, such as: Diabetes. Heart disease. Kidney disease. Being 41 years of age or older. Having a disability. Living in a place that is high above the ground or sea (high in altitude). The thinner, dried air causes more fluid loss. Doing exercises that put stress on your body for a long time. What are the signs or symptoms? Symptoms of dehydration depend on how bad it is. Mild or worse dehydration Thirst. Dry lips or dry mouth. Feeling dizzy or light-headed, especially when you stand up from sitting. Muscle cramps. Your body making: Dark pee (urine). Pee may be the color of tea. Less pee than normal. Less tears than normal. Headache. Very bad dehydration Changes in skin. Skin may: Be cold to the touch (clammy). Be blotchy or pale. Not go back to normal right after you lightly pinch  it and let it go. Little or no tears, pee, or sweat. Changes in vital signs, such as: Fast breathing. Low blood pressure. Weak pulse. Pulse that is more than 100 beats a minute when you are sitting still. Other changes, such as: Feeling very thirsty. Eyes that look hollow (sunken). Cold hands and feet. Being mixed up (confused). Being very tired (lethargic) or having trouble waking from sleep. Short-term weight loss. Loss of consciousness. How is this treated? Treatment for this condition depends on how bad it is. Treatment should start right away. Do not wait until your condition gets very bad. Very bad dehydration is an emergency. You will need to go to a hospital. Mild or worse dehydration can be treated at home. You may be asked to: Drink more fluids. Drink an oral rehydration solution (ORS). This drink helps get the right amounts of fluids and salts and minerals in the blood (electrolytes). Very bad dehydration can be treated: With fluids through an IV tube. By getting normal levels of salts and minerals in your blood. This is often done by giving salts and minerals through a tube. The tube is passed through your nose and into your stomach. By treating the root cause. Follow these instructions at home: Oral rehydration solution If told by your doctor, drink an ORS: Make an ORS. Use instructions on the package. Start by drinking small amounts, about  cup (120 mL) every 5-10 minutes. Slowly drink more until you have had the amount that your doctor said to have. Eating and drinking  Drink enough clear fluid to keep your pee pale yellow. If you were told to drink an ORS, finish the ORS first. Then, start slowly drinking other clear fluids. Drink fluids such as: Water. Do not drink only water. Doing that can make the salt (sodium) level in your body get too low. Water from ice chips you suck on. Fruit juice that you have added water to (diluted). Low-calorie sports  drinks. Eat foods that have the right amounts of salts and minerals, such as: Bananas. Oranges. Potatoes. Tomatoes. Spinach. Do not drink alcohol. Avoid: Drinks that have a lot of sugar. These include: High-calorie sports drinks. Fruit juice that you did not add water to. Soda. Caffeine. Foods that are greasy or have a lot of fat or sugar. General instructions Take over-the-counter and prescription medicines only as told by your doctor. Do not take salt tablets. Doing that can make the salt level in your body get too high. Return to your normal activities as told by your doctor. Ask your doctor what activities are safe for you. Keep all follow-up visits as told by your doctor. This is important. Contact a doctor if: You have pain in your belly (abdomen) and the pain: Gets worse. Stays in one place. You have a rash. You have a stiff neck. You get angry or annoyed (irritable) more easily than normal. You are more tired or have a harder time waking than normal. You feel: Weak or dizzy. Very thirsty. Get help right away if you have: Any symptoms of very bad dehydration. Symptoms of vomiting, such as: You cannot eat or drink without vomiting. Your vomiting gets worse or does not go away. Your vomit has blood or green stuff in it. Symptoms that get worse with treatment. A fever. A very bad headache. Problems with peeing or pooping (having a bowel movement), such as: Watery poop that gets worse or does not go away. Blood in your poop (stool). This may cause poop to look black and tarry. Not peeing in 6-8 hours. Peeing only a small amount of very dark pee in 6-8 hours. Trouble breathing. These symptoms may be an emergency. Do not wait to see if the symptoms will go away. Get medical help right away. Call your local emergency services (911 in the [place] Do not drive yourself to the hospital. Summary Dehydration is a condition in which there is not enough water or other fluids  in the body. This happens when a person loses more fluids than he or she takes in. Treatment for this condition depends on how bad it is. Treatment should be started right away. Do not wait until your condition gets very bad. Drink enough clear fluid to keep your pee pale yellow. If you were told to drink an oral rehydration solution (ORS), finish the ORS first. Then, start slowly drinking other clear fluids. Take over-the-counter and prescription medicines only as told by your doctor. Get help right away if you have any symptoms of very bad dehydration. This information is not intended to replace advice given to you by your health care provider. Make sure you discuss any questions you have with your health care provider. Document Revised: 01/25/2019 Document Reviewed: 01/25/2019 Elsevier Patient Education  [place]. It is important to avoid accidents which may result in broken bones.  Here are a few ideas on how to make your home safer so you will be less likely to trip or fall.  Use nonskid mats or non slip strips in your shower  or tub, on your bathroom floor and around sinks.  If you know that you have spilled water, wipe it up! In the bathroom, it is important to have properly installed grab bars on the walls or on the edge of the tub.  Towel racks are NOT strong enough for you to hold onto or to pull on for support. Stairs and hallways should have enough light.  Add lamps or night lights if you need ore light. It is good to have handrails on both sides of the stairs if possible.  Always fix broken handrails right away. It is important to see the edges of steps.  Paint the edges of outdoor steps white so you can see them better.  Put colored tape on the edge of inside steps. Throw-rugs are dangerous because they can slide.  Removing the rugs is the best idea, but if they must stay, add adhesive carpet tape to prevent slipping. Do not keep things on stairs or in the halls.  Remove small  furniture that blocks the halls as it may cause you to trip.  Keep telephone and electrical cords out of the way where you walk. Always were sturdy, rubber-soled shoes for good support.  Never wear just socks, especially on the stairs.  Socks may cause you to slip or fall.  Do not wear full-length housecoats as you can easily trip on the bottom.  Place the things you use the most on the shelves that are the easiest to reach.  If you use a stepstool, make sure it is in good condition.  If you feel unsteady, DO NOT climb, ask for help. If a health professional advises you to use a cane or walker, do not be ashamed.  These items can keep you from falling and breaking your bones.

## 2021-12-07 NOTE — Telephone Encounter (Signed)
Per Almira Bar, NP this has been a routine issue with him after treatment which was last week.  Infusion center able to give fluids at 11:00.  Spouse notified and in agreed.

## 2021-12-10 ENCOUNTER — Encounter: Payer: Self-pay | Admitting: Oncology

## 2021-12-11 ENCOUNTER — Telehealth: Payer: Self-pay

## 2021-12-11 NOTE — Telephone Encounter (Signed)
Letta Median called stating that patient has fallen twice already and dizzy when standing. Eating not as good as it has been. Talked with Dayton Scrape, FNP-BC. Instructed family to take patient to Emergency Room due patient falling and infusion not having room for add-on today.

## 2021-12-16 ENCOUNTER — Other Ambulatory Visit: Payer: Self-pay

## 2021-12-17 ENCOUNTER — Inpatient Hospital Stay: Payer: Medicare Other | Admitting: Hematology and Oncology

## 2021-12-17 ENCOUNTER — Inpatient Hospital Stay: Payer: Medicare Other

## 2021-12-17 ENCOUNTER — Encounter: Payer: Self-pay | Admitting: Hematology and Oncology

## 2021-12-17 VITALS — BP 113/66 | HR 94 | Temp 97.8°F | Resp 18 | Ht 68.0 in | Wt 187.0 lb

## 2021-12-17 DIAGNOSIS — Z5111 Encounter for antineoplastic chemotherapy: Secondary | ICD-10-CM | POA: Diagnosis not present

## 2021-12-17 DIAGNOSIS — E860 Dehydration: Secondary | ICD-10-CM

## 2021-12-17 DIAGNOSIS — C3492 Malignant neoplasm of unspecified part of left bronchus or lung: Secondary | ICD-10-CM

## 2021-12-17 DIAGNOSIS — R112 Nausea with vomiting, unspecified: Secondary | ICD-10-CM

## 2021-12-17 LAB — BASIC METABOLIC PANEL
BUN: 13 (ref 4–21)
CO2: 27 — AB (ref 13–22)
Chloride: 92 — AB (ref 99–108)
Creatinine: 0.8 (ref 0.6–1.3)
Glucose: 134
Potassium: 4.4 mEq/L (ref 3.5–5.1)
Sodium: 127 — AB (ref 137–147)

## 2021-12-17 LAB — CBC AND DIFFERENTIAL
HCT: 25 — AB (ref 41–53)
Hemoglobin: 8.8 — AB (ref 13.5–17.5)
Neutrophils Absolute: 0.31
Platelets: 209 10*3/uL (ref 150–400)
WBC: 1.8

## 2021-12-17 LAB — COMPREHENSIVE METABOLIC PANEL
Albumin: 4.1 (ref 3.5–5.0)
Calcium: 8.3 — AB (ref 8.7–10.7)

## 2021-12-17 LAB — HEPATIC FUNCTION PANEL
ALT: 23 U/L (ref 10–40)
AST: 21 (ref 14–40)
Alkaline Phosphatase: 82 (ref 25–125)
Bilirubin, Total: 0.6

## 2021-12-17 LAB — CBC: RBC: 2.76 — AB (ref 3.87–5.11)

## 2021-12-17 MED ORDER — SODIUM CHLORIDE 0.9% FLUSH
10.0000 mL | Freq: Once | INTRAVENOUS | Status: AC | PRN
  Administered 2021-12-17: 10 mL

## 2021-12-17 MED ORDER — HEPARIN SOD (PORK) LOCK FLUSH 100 UNIT/ML IV SOLN
500.0000 [IU] | Freq: Once | INTRAVENOUS | Status: AC | PRN
  Administered 2021-12-17: 500 [IU]

## 2021-12-17 MED ORDER — SODIUM CHLORIDE 0.9 % IV SOLN: Freq: Once | INTRAVENOUS | Status: AC

## 2021-12-17 NOTE — Progress Notes (Cosign Needed)
Patient Care Team: [name], NP as PCP - General Valrie Hart, RN as Oncology Nurse Navigator (Oncology) Marice Potter, MD as Consulting Physician (Oncology)  Clinic Day:  12/17/2021  Referring physician: [name], NP  ASSESSMENT & PLAN:   Assessment & Plan: Squamous cell carcinoma of lung, stage II, left [facility]) An 81 y.o. 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 He was placed on adjuvant carboplatin/paclitaxel chemotherapy. He completed all 4 cycles of chemotherapy 6/12 and is due for imaging in a couple of weeks. He presents to clinic today for ongoing dizziness, lightheadedness and falls. CBC is as expected 10 days after chemotherapy with hemoglobin 8.8, white count 1.8 and ANC 0.31.We will plan for 1 liter of IVF in hopes of alleviating his symptoms. I do expect that his counts should start to improve. He will keep scheduled appointments with Dr. Bobby Rumpf.     The patient understands the plans discussed today and is in agreement with them.  He knows to contact our office if he develops concerns prior to his next appointment.    Melodye Ped, NP  [facility] [address] [place] [address] Dept: [phone] Dept Fax: [phone]   Orders Placed This Encounter  Procedures   CBC and differential    This external order was created through the Results Console.   CBC    This external order was created through the Results Console.   Basic metabolic panel    This external order was created through the Results Console.   Comprehensive metabolic panel    This external order was created through the Results Console.   Hepatic function panel    This external order was created through the Results Console.      CHIEF COMPLAINT:  CC: an 81 year old male with history of lung cancer here for  symptom management; dizziness and lightheadedness  Current Treatment:  Surveillance  INTERVAL HISTORY:  Derek Richards is here today for repeat clinical assessment. He denies fevers or chills. He denies pain. His appetite is good. His weight has been stable.  I have reviewed the past medical history, past surgical history, social history and family history with the patient and they are unchanged from previous note.  ALLERGIES:  is allergic to chlorhexidine.  MEDICATIONS:  Current Outpatient Medications  Medication Sig Dispense Refill   acetaminophen (TYLENOL) 500 MG tablet Take 500 mg by mouth every 6 (six) hours as needed. (Patient not taking: Reported on 10/13/2021)     aspirin 81 MG EC tablet Take 81 mg by mouth daily.     dexamethasone (DECADRON) 4 MG tablet Take 5 tabs at the night before and 5 tab the morning of chemotherapy, every 3 weeks, by mouth 60 tablet 0   diphenhydramine-acetaminophen (TYLENOL PM) 25-500 MG TABS tablet Take 1 tablet by mouth at bedtime as needed.     Lidocaine 4 % PTCH Apply 1 patch topically daily as needed (pain). (Patient not taking: Reported on 10/13/2021)     lisinopril (ZESTRIL) 5 MG tablet Take 5 mg by mouth daily. Patient reports holding id blood pressure is low     Multiple Vitamin (MULTI-VITAMIN) tablet Take 1 tablet by mouth daily.     naproxen sodium (ALEVE) 220 MG tablet Take by mouth. (Patient not taking: Reported on 10/13/2021)     ondansetron (  ZOFRAN) 4 MG tablet Take 1 tablet (4 mg total) by mouth every 4 (four) hours as needed for nausea. (Patient not taking: Reported on 10/13/2021) 90 tablet 3   pantoprazole (PROTONIX) 40 MG tablet Take 1 tablet by mouth daily.     prochlorperazine (COMPAZINE) 10 MG tablet Take 1 tablet (10 mg total) by mouth every 6 (six) hours as needed for nausea or vomiting. (Patient not taking: Reported on 10/13/2021) 90 tablet 3   traMADol (ULTRAM) 50 MG tablet Take 1-2 tablets (50-100 mg total) by mouth every 6 (six) hours as needed  for moderate pain (mild pain). 90 tablet 0   No current facility-administered medications for this visit.    HISTORY OF PRESENT ILLNESS:   Oncology History  Squamous cell carcinoma of lung, stage II, left ([place])  08/13/2021 Initial Diagnosis   Squamous cell carcinoma of lung, stage II, left ([place])   09/07/2021 Cancer Staging   Staging form: Lung, AJCC 8th Edition - Clinical: Stage IIB (cT2a, cN1, cM0) - Signed by Curt Bears, MD on 09/07/2021   09/30/2021 - 12/02/2021 Chemotherapy   Patient is on Treatment Plan : LUNG Carboplatin + Paclitaxel q21d         REVIEW OF SYSTEMS:   Constitutional: Denies fevers, chills or abnormal weight loss Eyes: Denies blurriness of vision Ears, nose, mouth, throat, and face: Denies mucositis or sore throat Respiratory: Denies cough, dyspnea or wheezes Cardiovascular: Denies palpitation, chest discomfort or lower extremity swelling Gastrointestinal:  Denies nausea, heartburn or change in bowel habits Skin: Denies abnormal skin rashes Lymphatics: Denies new lymphadenopathy or easy bruising Neurological:Denies numbness, tingling or new weaknesses Behavioral/Psych: Mood is stable, no new changes  All other systems were reviewed with the patient and are negative.   VITALS:  Blood pressure 113/66, pulse 94, temperature 97.8 F (36.6 C), temperature source Oral, resp. rate 18, height 5\' 8"  (1.727 m), weight 187 lb (84.8 kg), SpO2 97 %.  Wt Readings from Last 3 Encounters:  12/17/21 187 lb (84.8 kg)  12/02/21 189 lb (85.7 kg)  11/30/21 189 lb 3.2 oz (85.8 kg)    Body mass index is 28.43 kg/m.  Performance status (ECOG): 1 - Symptomatic but completely ambulatory  PHYSICAL EXAM:   GENERAL:alert, no distress and comfortable SKIN: skin color, texture, turgor are normal, no rashes or significant lesions EYES: normal, Conjunctiva are pink and non-injected, sclera clear OROPHARYNX:no exudate, no erythema and lips, buccal mucosa, and tongue normal   NECK: supple, thyroid normal size, non-tender, without nodularity LYMPH:  no palpable lymphadenopathy in the cervical, axillary or inguinal LUNGS: clear to auscultation and percussion with normal breathing effort HEART: regular rate & rhythm and no murmurs and no lower extremity edema ABDOMEN:abdomen soft, non-tender and normal bowel sounds Musculoskeletal:no cyanosis of digits and no clubbing  NEURO: alert & oriented x 3 with fluent speech, no focal motor/sensory deficits  LABORATORY DATA:  I have reviewed the data as listed    Component Value Date/Time   NA 127 (A) 12/17/2021 0000   K 4.4 12/17/2021 0000   CL 92 (A) 12/17/2021 0000   CO2 27 (A) 12/17/2021 0000   GLUCOSE 94 09/07/2021 1348   BUN 13 12/17/2021 0000   CREATININE 0.8 12/17/2021 0000   CREATININE 0.98 09/07/2021 1348   CALCIUM 8.3 (A) 12/17/2021 0000   PROT 7.0 09/07/2021 1348   ALBUMIN 4.1 12/17/2021 0000   AST 21 12/17/2021 0000   AST 17 09/07/2021 1348   ALT 23 12/17/2021 0000   ALT  22 09/07/2021 1348   ALKPHOS 82 12/17/2021 0000   BILITOT 0.5 09/07/2021 1348   GFRNONAA >60 09/07/2021 1348    No results found for: "SPEP", "UPEP"  Lab Results  Component Value Date   WBC 1.8 12/17/2021   NEUTROABS 0.31 12/17/2021   HGB 8.8 (A) 12/17/2021   HCT 25 (A) 12/17/2021   MCV 90 10/19/2021   PLT 209 12/17/2021      Chemistry      Component Value Date/Time   NA 127 (A) 12/17/2021 0000   K 4.4 12/17/2021 0000   CL 92 (A) 12/17/2021 0000   CO2 27 (A) 12/17/2021 0000   BUN 13 12/17/2021 0000   CREATININE 0.8 12/17/2021 0000   CREATININE 0.98 09/07/2021 1348   GLU 134 12/17/2021 0000      Component Value Date/Time   CALCIUM 8.3 (A) 12/17/2021 0000   ALKPHOS 82 12/17/2021 0000   AST 21 12/17/2021 0000   AST 17 09/07/2021 1348   ALT 23 12/17/2021 0000   ALT 22 09/07/2021 1348   BILITOT 0.5 09/07/2021 1348       RADIOGRAPHIC STUDIES: I have personally reviewed the radiological images as listed and  agreed with the findings in the report. No results found.

## 2021-12-17 NOTE — Assessment & Plan Note (Signed)
An 81 y.o. 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 He was placed on adjuvant carboplatin/paclitaxel chemotherapy. He completed all 4 cycles of chemotherapy 6/12 and is due for imaging in a couple of weeks. He presents to clinic today for ongoing dizziness, lightheadedness and falls. CBC is as expected 10 days after chemotherapy with hemoglobin 8.8, white count 1.8 and ANC 0.31.We will plan for 1 liter of IVF in hopes of alleviating his symptoms. I do expect that his counts should start to improve. He will keep scheduled appointments with Dr. Bobby Rumpf.

## 2021-12-17 NOTE — Addendum Note (Signed)
Addended byGeorgette Shell on: 12/17/2021 03:52 PM   Modules accepted: Orders

## 2021-12-22 ENCOUNTER — Telehealth: Payer: Self-pay | Admitting: Dietician

## 2022-01-12 ENCOUNTER — Other Ambulatory Visit: Payer: Self-pay | Admitting: Oncology

## 2022-01-12 NOTE — Progress Notes (Signed)
Derek Richards  [address] [place],  [place]  [address] [phone]  Clinic Day:  01/12/2022  Referring physician: [name], NP  HISTORY OF PRESENT ILLNESS:  The patient is an 81 y.o. 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  He comes in today to go over his CT scans to ascertain his new disease baseline after receiving 4 cycles of adjuvant carboplatin/paclitaxel chemotherapy.  He claims to have tolerated his 4th cycle of chemotherapy okay.  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   As it pertains to his lung cancer, he denies having any new symptoms or findings which concern him for early disease recurrence.  PHYSICAL EXAM:  Blood pressure 114/64, pulse 96, temperature 97.6 F (36.4 C), resp. rate 18, height 5\' 8"  (1.727 m), weight 187 lb 1.6 oz (84.9 kg), SpO2 95 %. Wt Readings from Last 3 Encounters:  01/13/22 187 lb 1.6 oz (84.9 kg)  12/17/21 187 lb (84.8 kg)  12/02/21 189 lb (85.7 kg)   Body mass index is 28.45 kg/m. Performance status (ECOG): 1 - Symptomatic but completely ambulatory Physical Exam Constitutional:      Appearance: Normal appearance. He is not ill-appearing.  HENT:     Mouth/Throat:     Mouth: Mucous membranes are moist.     Pharynx: Oropharynx is clear. No oropharyngeal exudate or posterior oropharyngeal erythema.  Cardiovascular:     Rate and Rhythm: Normal rate and regular rhythm.     Heart sounds: No murmur heard.    No friction rub. No gallop.  Pulmonary:     Effort: Pulmonary effort is normal. No respiratory distress.     Breath sounds: Normal breath sounds. No wheezing, rhonchi or rales.  Abdominal:     General: Bowel sounds are normal. There is no distension.     Palpations: Abdomen is soft. There is no mass.     Tenderness: There is no abdominal tenderness.  Musculoskeletal:        General: No swelling.     Right lower leg:  No edema.     Left lower leg: No edema.  Lymphadenopathy:     Cervical: No cervical adenopathy.     Upper Body:     Right upper body: No supraclavicular or axillary adenopathy.     Left upper body: No supraclavicular or axillary adenopathy.     Lower Body: No right inguinal adenopathy. No left inguinal adenopathy.  Skin:    General: Skin is warm.     Coloration: Skin is not jaundiced.     Findings: No lesion or rash.  Neurological:     General: No focal deficit present.     Mental Status: He is alert and oriented to person, place, and time. Mental status is at baseline.  Psychiatric:        Mood and Affect: Mood normal.        Behavior: Behavior normal.        Thought Content: Thought content normal.   SCANS:  His chest CT revealed the following: FINDINGS: Cardiovascular: The heart size is normal. No substantial pericardial effusion. Coronary artery calcification is evident. Mild atherosclerotic calcification is noted in the wall of the thoracic aorta. Left Port-A-Cath tip is positioned in the distal SVC.  Mediastinum/Nodes: No mediastinal lymphadenopathy. There is no hilar lymphadenopathy. The esophagus has normal imaging features. There is no axillary lymphadenopathy.  Lungs/Pleura: Status post  left upper lobectomy. Small focus of loculated chronic postoperative fluid/pleural thickening noted anterior left hemithorax (image 50/2). Centrilobular and paraseptal emphysema evident. Similar appearance of right apical pleuroparenchymal scarring. 3 mm right lower lobe subpleural nodule on 82/4 is new in the interval. No focal airspace consolidation. No pleural effusion.  Upper Abdomen: Tiny hypodensity in the anterior right liver measures about 6 mm diameter, too small to characterize but most likely benign. No evidence for adrenal mass  Musculoskeletal: No worrisome lytic or sclerotic osseous abnormality.  IMPRESSION: 1. Status post left upper lobectomy. No evidence for  recurrent or metastatic disease in the chest. 2. 3 mm right lower lobe subpleural nodule, new in the interval. Likely a subpleural lymph node. Attention on follow-up recommended. 3. Aortic Atherosclerosis (ICD10-I70.0) and Emphysema (ICD10-J43.9).  LABS:      Latest Ref Rng & Units 12/17/2021   12:00 AM 11/30/2021   12:00 AM 11/09/2021   12:00 AM  CBC  WBC  1.8     5.1     3.7      Hemoglobin 13.5 - 17.5 8.8     10.1     11.2      Hematocrit 41 - 53 25     29     33      Platelets 150 - 400 K/uL 209     286     325         This result is from an external source.      Latest Ref Rng & Units 12/17/2021   12:00 AM 11/30/2021   12:00 AM 11/09/2021   12:00 AM  CMP  BUN 4 - 21 13     13     9       Creatinine 0.6 - 1.3 0.8     0.8     0.9      Sodium 137 - 147 127     129     128      Potassium 3.5 - 5.1 mEq/L 4.4     4.7     4.5      Chloride 99 - 108 92     90     90      CO2 13 - 22 27     27     26       Calcium 8.7 - 10.7 8.3     8.8     8.9      Alkaline Phos 25 - 125 82     86     80      AST 14 - 40 21     21     21       ALT 10 - 40 U/L 23     22     23          This result is from an external source.   ASSESSMENT & PLAN:  An 81 y.o. 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  He also finished 4 cycles of adjuvant carboplatin/paclitaxel in June 2023.  In clinic today, I went over all of his CT scan images with him, for which he could see that there is no obvious evidence of disease recurrence.  Of note, there is a small subpleural lesion in his right lower lobe of undetermined significance.  Although I do not believe this is a malignant lesion, it will continue to be followed with serial  CT scans.  Clinically, the patient appears to be doing well.  I will see him back in 4 months for repeat clinical assessment, with a chest CT being done a day before his next visit for his continued radiographic lung cancer  surveillance.  The patient understands all the plans discussed today and is in agreement with them.  Remington Skalsky Macarthur Critchley, MD

## 2022-01-13 ENCOUNTER — Encounter: Payer: Self-pay | Admitting: Oncology

## 2022-01-13 ENCOUNTER — Telehealth: Payer: Self-pay | Admitting: Oncology

## 2022-01-13 ENCOUNTER — Inpatient Hospital Stay: Payer: Medicare Other | Attending: Internal Medicine | Admitting: Oncology

## 2022-01-13 VITALS — BP 114/64 | HR 96 | Temp 97.6°F | Resp 18 | Ht 68.0 in | Wt 187.1 lb

## 2022-01-13 DIAGNOSIS — C3492 Malignant neoplasm of unspecified part of left bronchus or lung: Secondary | ICD-10-CM

## 2022-01-13 NOTE — Telephone Encounter (Signed)
Per 01/13/22 los next appt scheduled and confirmed with patient

## 2022-04-13 LAB — BASIC METABOLIC PANEL
BUN: 11 (ref 4–21)
CO2: 28 — AB (ref 13–22)
Chloride: 90 — AB (ref 99–108)
Creatinine: 1 (ref 0.6–1.3)
Glucose: 91
Potassium: 4.7 mEq/L (ref 3.5–5.1)
Sodium: 128 — AB (ref 137–147)

## 2022-04-13 LAB — CBC AND DIFFERENTIAL
HCT: 33 — AB (ref 41–53)
Hemoglobin: 11.3 — AB (ref 13.5–17.5)
Neutrophils Absolute: 1.5
Platelets: 300 10*3/uL (ref 150–400)
WBC: 3.8

## 2022-04-13 LAB — HEPATIC FUNCTION PANEL
ALT: 12 U/L (ref 10–40)
AST: 13 — AB (ref 14–40)
Alkaline Phosphatase: 78 (ref 25–125)
Bilirubin, Total: 0.6

## 2022-04-13 LAB — LIPID PANEL
Cholesterol: 187 (ref 0–200)
HDL: 43 (ref 35–70)
LDL Cholesterol: 124
Triglycerides: 98 (ref 40–160)

## 2022-04-13 LAB — CBC: RBC: 3.59 — AB (ref 3.87–5.11)

## 2022-04-13 LAB — COMPREHENSIVE METABOLIC PANEL
Albumin: 4 (ref 3.5–5.0)
Calcium: 9.1 (ref 8.7–10.7)
Globulin: 3
eGFR: 75.7

## 2022-04-13 LAB — TSH: TSH: 2.11 (ref 0.41–5.90)

## 2022-04-13 LAB — VITAMIN B12: Vitamin B-12: 729

## 2022-04-27 ENCOUNTER — Telehealth: Payer: Self-pay | Admitting: Oncology

## 2022-04-27 NOTE — Telephone Encounter (Signed)
CT Chest has been scheduled for scheduled for 05/17/22 @ 10 am; Check in @ 9 am  Left message for pt with appt date,time and instructions.

## 2022-05-11 ENCOUNTER — Telehealth: Payer: Self-pay | Admitting: Oncology

## 2022-05-11 NOTE — Telephone Encounter (Signed)
05/11/22 Wife called and stated patient is refusing ct scans and all future visits.All appts cancelled

## 2022-05-18 ENCOUNTER — Ambulatory Visit: Payer: Medicare Other | Admitting: Oncology

## 2022-07-29 ENCOUNTER — Encounter: Payer: Self-pay | Admitting: *Deleted

## 2022-07-29 ENCOUNTER — Encounter: Payer: Self-pay | Admitting: Cardiology

## 2022-07-29 DIAGNOSIS — K219 Gastro-esophageal reflux disease without esophagitis: Secondary | ICD-10-CM

## 2022-07-29 HISTORY — DX: Gastro-esophageal reflux disease without esophagitis: K21.9
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
## 2022-08-05 ENCOUNTER — Ambulatory Visit: Payer: Medicare Other | Attending: Cardiology

## 2022-08-05 ENCOUNTER — Ambulatory Visit: Payer: Medicare Other | Attending: Cardiology | Admitting: Cardiology

## 2022-08-05 ENCOUNTER — Encounter: Payer: Self-pay | Admitting: Cardiology

## 2022-08-05 VITALS — BP 152/80 | HR 88 | Ht 69.0 in | Wt 183.4 lb

## 2022-08-05 DIAGNOSIS — I6522 Occlusion and stenosis of left carotid artery: Secondary | ICD-10-CM

## 2022-08-05 DIAGNOSIS — R42 Dizziness and giddiness: Secondary | ICD-10-CM | POA: Diagnosis not present

## 2022-08-05 DIAGNOSIS — I4510 Unspecified right bundle-branch block: Secondary | ICD-10-CM

## 2022-08-05 DIAGNOSIS — R55 Syncope and collapse: Secondary | ICD-10-CM

## 2022-08-05 DIAGNOSIS — I739 Peripheral vascular disease, unspecified: Secondary | ICD-10-CM

## 2022-08-05 DIAGNOSIS — I70203 Unspecified atherosclerosis of native arteries of extremities, bilateral legs: Secondary | ICD-10-CM

## 2022-08-05 DIAGNOSIS — I10 Essential (primary) hypertension: Secondary | ICD-10-CM

## 2022-08-05 NOTE — Patient Instructions (Addendum)
Medication Instructions:  Your physician recommends that you continue on your current medications as directed. Please refer to the Current Medication list given to you today.  *If you need a refill on your cardiac medications before your next appointment, please call your pharmacy*   Lab Work: None If you have labs (blood work) drawn today and your tests are completely normal, you will receive your results only by: [place] (if you have MyChart) OR A paper copy in the mail If you have any lab test that is abnormal or we need to change your treatment, we will call you to review the results.   Testing/Procedures: Your physician has recommended that you wear an event monitor. Event monitors are medical devices that record the heart's electrical activity. Doctors most often [place] these monitors to diagnose arrhythmias. Arrhythmias are problems with the speed or rhythm of the heartbeat. The monitor is a small, portable device. You can wear one while you do your normal daily activities. This is usually used to diagnose what is causing palpitations/syncope (passing out).    Follow-Up: At [facility], you and your health needs are our priority.  As part of our continuing mission to provide you with exceptional heart care, we have created designated Provider Care Teams.  These Care Teams include your primary Cardiologist (physician) and Advanced Practice Providers (APPs -  Physician Assistants and Nurse Practitioners) who all work together to provide you with the care you need, when you need it.  We recommend signing up for the patient portal called "MyChart".  Sign up information is provided on this After Visit Summary.  MyChart is used to connect with patients for Virtual Visits (Telemedicine).  Patients are able to view lab/test results, encounter notes, upcoming appointments, etc.  Non-urgent messages can be sent to your provider as well.   To learn more about what you can do with  MyChart, go to [email or web address].    Your next appointment:   8 week(s)  Provider:   Shirlee More, MD    Other Instructions Get a new [employer] blood pressure device  Check home blood pressures both sitting and standing and record daily.  Sit on the toilet at night time  This visit was accompanied by Darius Bump.

## 2022-08-05 NOTE — Discharge Summary (Signed)
This visit was accompanied by Darius Bump, CMA.

## 2022-08-23 ENCOUNTER — Telehealth: Payer: Self-pay | Admitting: Cardiology

## 2022-08-23 NOTE — Telephone Encounter (Signed)
Patient's wife states the patient had such a hard time with his monitor over the weekend that he removed it. Patient's wife states states monitor kept going off every time he turned over and he was unable to get any sleep. She states they do not have anymore adhesive tabs and she would like to know if they can come by the office for assistance with re-applying it.

## 2022-08-23 NOTE — Telephone Encounter (Signed)
Spoke with pt's wife and the pt is having problems with his monitor not sticking. They are wanting to come in for help with re applying the monitor. Advised to come before 11am. Spouse agreed and verbalized understanding.

## 2022-09-22 DIAGNOSIS — C801 Malignant (primary) neoplasm, unspecified: Secondary | ICD-10-CM | POA: Insufficient documentation
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
## 2022-09-30 ENCOUNTER — Ambulatory Visit: Payer: Medicare Other | Attending: Cardiology | Admitting: Cardiology

## 2022-09-30 ENCOUNTER — Encounter: Payer: Self-pay | Admitting: Cardiology

## 2022-09-30 VITALS — BP 124/70 | HR 88 | Ht 69.0 in | Wt 178.0 lb

## 2022-09-30 DIAGNOSIS — R55 Syncope and collapse: Secondary | ICD-10-CM

## 2022-09-30 DIAGNOSIS — I10 Essential (primary) hypertension: Secondary | ICD-10-CM | POA: Diagnosis not present

## 2022-09-30 NOTE — Patient Instructions (Signed)
Medication Instructions:  Your physician recommends that you continue on your current medications as directed. Please refer to the Current Medication list given to you today.  *If you need a refill on your cardiac medications before your next appointment, please call your pharmacy*   Lab Work: None If you have labs (blood work) drawn today and your tests are completely normal, you will receive your results only by: MyChart Message (if you have MyChart) OR A paper copy in the mail If you have any lab test that is abnormal or we need to change your treatment, we will call you to review the results.   Testing/Procedures: None   Follow-Up: At [facility], you and your health needs are our priority.  As part of our continuing mission to provide you with exceptional heart care, we have created designated Provider Care Teams.  These Care Teams include your primary Cardiologist (physician) and Advanced Practice Providers (APPs -  Physician Assistants and Nurse Practitioners) who all work together to provide you with the care you need, when you need it.  We recommend signing up for the patient portal called "MyChart".  Sign up information is provided on this After Visit Summary.  MyChart is used to connect with patients for Virtual Visits (Telemedicine).  Patients are able to view lab/test results, encounter notes, upcoming appointments, etc.  Non-urgent messages can be sent to your provider as well.   To learn more about what you can do with MyChart, go to [email or web address].    Your next appointment:   Follow up as needed  Provider:   Brian Munley, MD    Other Instructions None  

## 2023-06-14 LAB — LAB REPORT - SCANNED
A1c: 5.3
Creatinine, POC: 173.97 mg/dL
EGFR: 84.2
Microalb Creat Ratio: 35.2

## 2023-09-13 ENCOUNTER — Telehealth: Payer: Self-pay | Admitting: Cardiology

## 2023-09-13 NOTE — Telephone Encounter (Signed)
 Pt c/o Syncope: STAT if syncope occurred within 30 minutes and pt complains of lightheadedness High Priority if episode of passing out, completely, today or in last 24 hours   Did you pass out today?  No    When is the last time you passed out?  [place]    Has this occurred multiple times?  Occurred twice on 3/11 while at golf course. EMS transported patient to [facility] in [place]. Had CT angio head and neck, xrays chest and pelvis, 2 EKGs, BP was extremely low, wife doesn't have reading, but systolic was far below 100. Sodium was low at 128 and patient was very dehydrated. Patient has since been discharged and summary advises to follow up with cardiology. Patient has also been in contact with Vascular/Vein specialist.  Did you have any symptoms prior to passing out?   No

## 2023-09-13 NOTE — Telephone Encounter (Signed)
 Spoke to wife, Lucendia Herrlich per [place]. She reports that patient was discharged from [facility] recently and that patient was told to follow up with our office. Made patient an appointment for 09/26/2023 with Wallis Bamberg, NP. Lucendia Herrlich verbalized understanding and had no further questions.

## 2023-09-26 ENCOUNTER — Ambulatory Visit: Admitting: Cardiology

## 2023-09-28 NOTE — Progress Notes (Signed)
 " Cardiology Office Note:  .   Date:  09/29/2023  ID:  Derek Richards, DOB Mar 26, 1941, MRN [id number] PCP: Silvano Angeline FALCON, NP  [facility] Providers Cardiologist:  Redell Leiter, MD    History of Present Illness: .   Derek Richards is a 83 y.o. male with a past medical history of carotid artery stenosis s/p R CEA 2018, PAD s/p left common iliac stent--follows with VVS at [facility], SCC stage II s/p LUL resection and chemo, COPD, RBBB, history of syncope and collapse.  09/27/2023 echo EF 60 to 65%, mild MR, mild TR, trace aortic regurgitation 09/15/2022 event monitor predominantly sinus rhythm with an average heart rate of 88 beats per minute, no critical, no serious events Carotid duplex 05/27/2021 right internal carotid artery 1 to 49% stenosis, left internal carotid artery 50 to 69% stenosed  He established care with Dr. Leiter for episodes of syncope.  Initially his episodes of syncope occurred in the context of chemotherapy was felt to be related to hypotension.  He wore an event monitor which was unrevealing for contributory causes.  Evaluated at the emergency department on 09/06/2023 after syncopal episode occurred while he was playing golf.  CT angiogram revealed no significant change in his carotid artery disease.  He was hyponatremic at 128.   He presents today accompanied by his wife for follow up after recent hospitalization. He has seen his PCP, labs were rechecked revealing a sodium of 133. He follows closely with VVS at [facility] and they have arranged for an ischemic evaluation which will be completed next week.  We discussed the events surrounding the syncopal episode, he was at a golf course but not yet begun to play golf was standing in a circle with his friends, had a few beers the previous day and admitted he did not feel good that entire morning, friends were commenting on him not looking well and then subsequently passed out. He denies chest pain, palpitations, dyspnea, pnd,  orthopnea, n, v, dizziness,  edema, weight gain, or early satiety.   ROS: Review of Systems  Neurological:  Positive for loss of consciousness.  All other systems reviewed and are negative.    Studies Reviewed: .        Cardiac Studies & Procedures   ______________________________________________________________________________________________        SHERRILEE  LONG TERM MONITOR (3-14 DAYS) 07/21/2022       ______________________________________________________________________________________________      Risk Assessment/Calculations:             Physical Exam:   VS:  BP 110/60   Pulse 98   Ht 5' 9 (1.753 m)   Wt 179 lb (81.2 kg)   SpO2 94%   BMI 26.43 kg/m    Wt Readings from Last 3 Encounters:  09/29/23 179 lb (81.2 kg)  09/30/22 178 lb (80.7 kg)  08/05/22 183 lb 6.4 oz (83.2 kg)    GEN: Well nourished, well developed in no acute distress NECK: No JVD; No carotid bruits CARDIAC: RRR, no murmurs, rubs, gallops RESPIRATORY:  Clear to auscultation without rales, wheezing or rhonchi  ABDOMEN: Soft, non-tender, non-distended EXTREMITIES:  No edema; No deformity   ASSESSMENT AND PLAN: .   Syncope and collapse-episode started after the completion of his chemotherapy 2 years ago.  Most recent episode as outlined above in the HPI, it does sound like there could have been a component of orthostasis.  His blood pressure is in the low normal range today and is typically  the same at home.  He also was drinking more than normal the prior day.  We did discuss a loop recorder implantation, he is not necessarily agreeable to this however his wife is encouraging it so we will refer him to discuss further with an EP provider.   Hyponatremia-will repeat BMET  RBBB-chronic.  Carotid artery stenosis/PAD-s/p R CEA in 2018, left common iliac stent; follows with [facility] at [facility].   Hypertension -blood pressure is controlled at 110/65, he is currently on lisinopril  2.5 mg daily.  We  will discontinue this and have him keep a log of his blood pressure for 2 weeks.       Dispo: BMET, refer to EP for loop recorder implantation/consideration.  Follow-up in 6 months.  Signed, Delon JAYSON Hoover, NP  "

## 2023-09-29 ENCOUNTER — Encounter: Payer: Self-pay | Admitting: Cardiology

## 2023-09-29 ENCOUNTER — Ambulatory Visit: Attending: Cardiology | Admitting: Cardiology

## 2023-09-29 VITALS — BP 110/60 | HR 98 | Ht 69.0 in | Wt 179.0 lb

## 2023-09-29 DIAGNOSIS — I4510 Unspecified right bundle-branch block: Secondary | ICD-10-CM | POA: Diagnosis not present

## 2023-09-29 DIAGNOSIS — R55 Syncope and collapse: Secondary | ICD-10-CM

## 2023-09-29 DIAGNOSIS — I739 Peripheral vascular disease, unspecified: Secondary | ICD-10-CM | POA: Diagnosis not present

## 2023-09-29 DIAGNOSIS — I6523 Occlusion and stenosis of bilateral carotid arteries: Secondary | ICD-10-CM

## 2023-09-29 DIAGNOSIS — E871 Hypo-osmolality and hyponatremia: Secondary | ICD-10-CM

## 2023-09-29 DIAGNOSIS — I10 Essential (primary) hypertension: Secondary | ICD-10-CM

## 2023-09-29 NOTE — Patient Instructions (Signed)
 Medication Instructions:    Stop Lisinopril   *If you need a refill on your cardiac medications before your next appointment, please call your pharmacy*   Lab Work: Your physician recommends that you have labs done in the office today. Your test included  basic metabolic panel.  If you have labs (blood work) drawn today and your tests are completely normal, you will receive your results only by: MyChart Message (if you have MyChart) OR A paper copy in the mail If you have any lab test that is abnormal or we need to change your treatment, we will call you to review the results.   Testing/Procedures: None Ordered   Follow-Up: At [facility], you and your health needs are our priority.  As part of our continuing mission to provide you with exceptional heart care, we have created designated Provider Care Teams.  These Care Teams include your primary Cardiologist (physician) and Advanced Practice Providers (APPs -  Physician Assistants and Nurse Practitioners) who all work together to provide you with the care you need, when you need it.  We recommend signing up for the patient portal called "MyChart".  Sign up information is provided on this After Visit Summary.  MyChart is used to connect with patients for Virtual Visits (Telemedicine).  Patients are able to view lab/test results, encounter notes, upcoming appointments, etc.  Non-urgent messages can be sent to your provider as well.   To learn more about what you can do with MyChart, go to [email or web address].    Your next appointment:   6 month follow up

## 2023-09-30 ENCOUNTER — Telehealth: Payer: Self-pay

## 2023-09-30 LAB — BASIC METABOLIC PANEL WITH GFR
BUN/Creatinine Ratio: 18 (ref 10–24)
BUN: 14 mg/dL (ref 8–27)
CO2: 24 mmol/L (ref 20–29)
Calcium: 9.5 mg/dL (ref 8.6–10.2)
Chloride: 90 mmol/L — ABNORMAL LOW (ref 96–106)
Creatinine, Ser: 0.76 mg/dL (ref 0.76–1.27)
Glucose: 73 mg/dL (ref 70–99)
Potassium: 4.9 mmol/L (ref 3.5–5.2)
Sodium: 129 mmol/L — ABNORMAL LOW (ref 134–144)
eGFR: 90 mL/min/1.73

## 2023-09-30 NOTE — Telephone Encounter (Signed)
 Left vm to return call.

## 2023-09-30 NOTE — Telephone Encounter (Signed)
-----   Message from Flossie Dibble sent at 09/30/2023  8:22 AM EDT ----- Sodium is 129. This is stable and actually the same as it was last year around this time.  Some people have hyponatremia and we do not know what causes it, could be medications (he is not on any known to cause this).   Focus on staying hydrated with mostly gatorade. Too much plain water can make this worse.

## 2023-11-30 IMAGING — DX DG CHEST 2V
2 series · 2 of 2 positions shown · non-contrast
Comparison: 08/13/2021

CLINICAL DATA: Status post wedge resection

EXAM:
CHEST - 2 VIEW

[dg chest 2 view (1 of 2)]
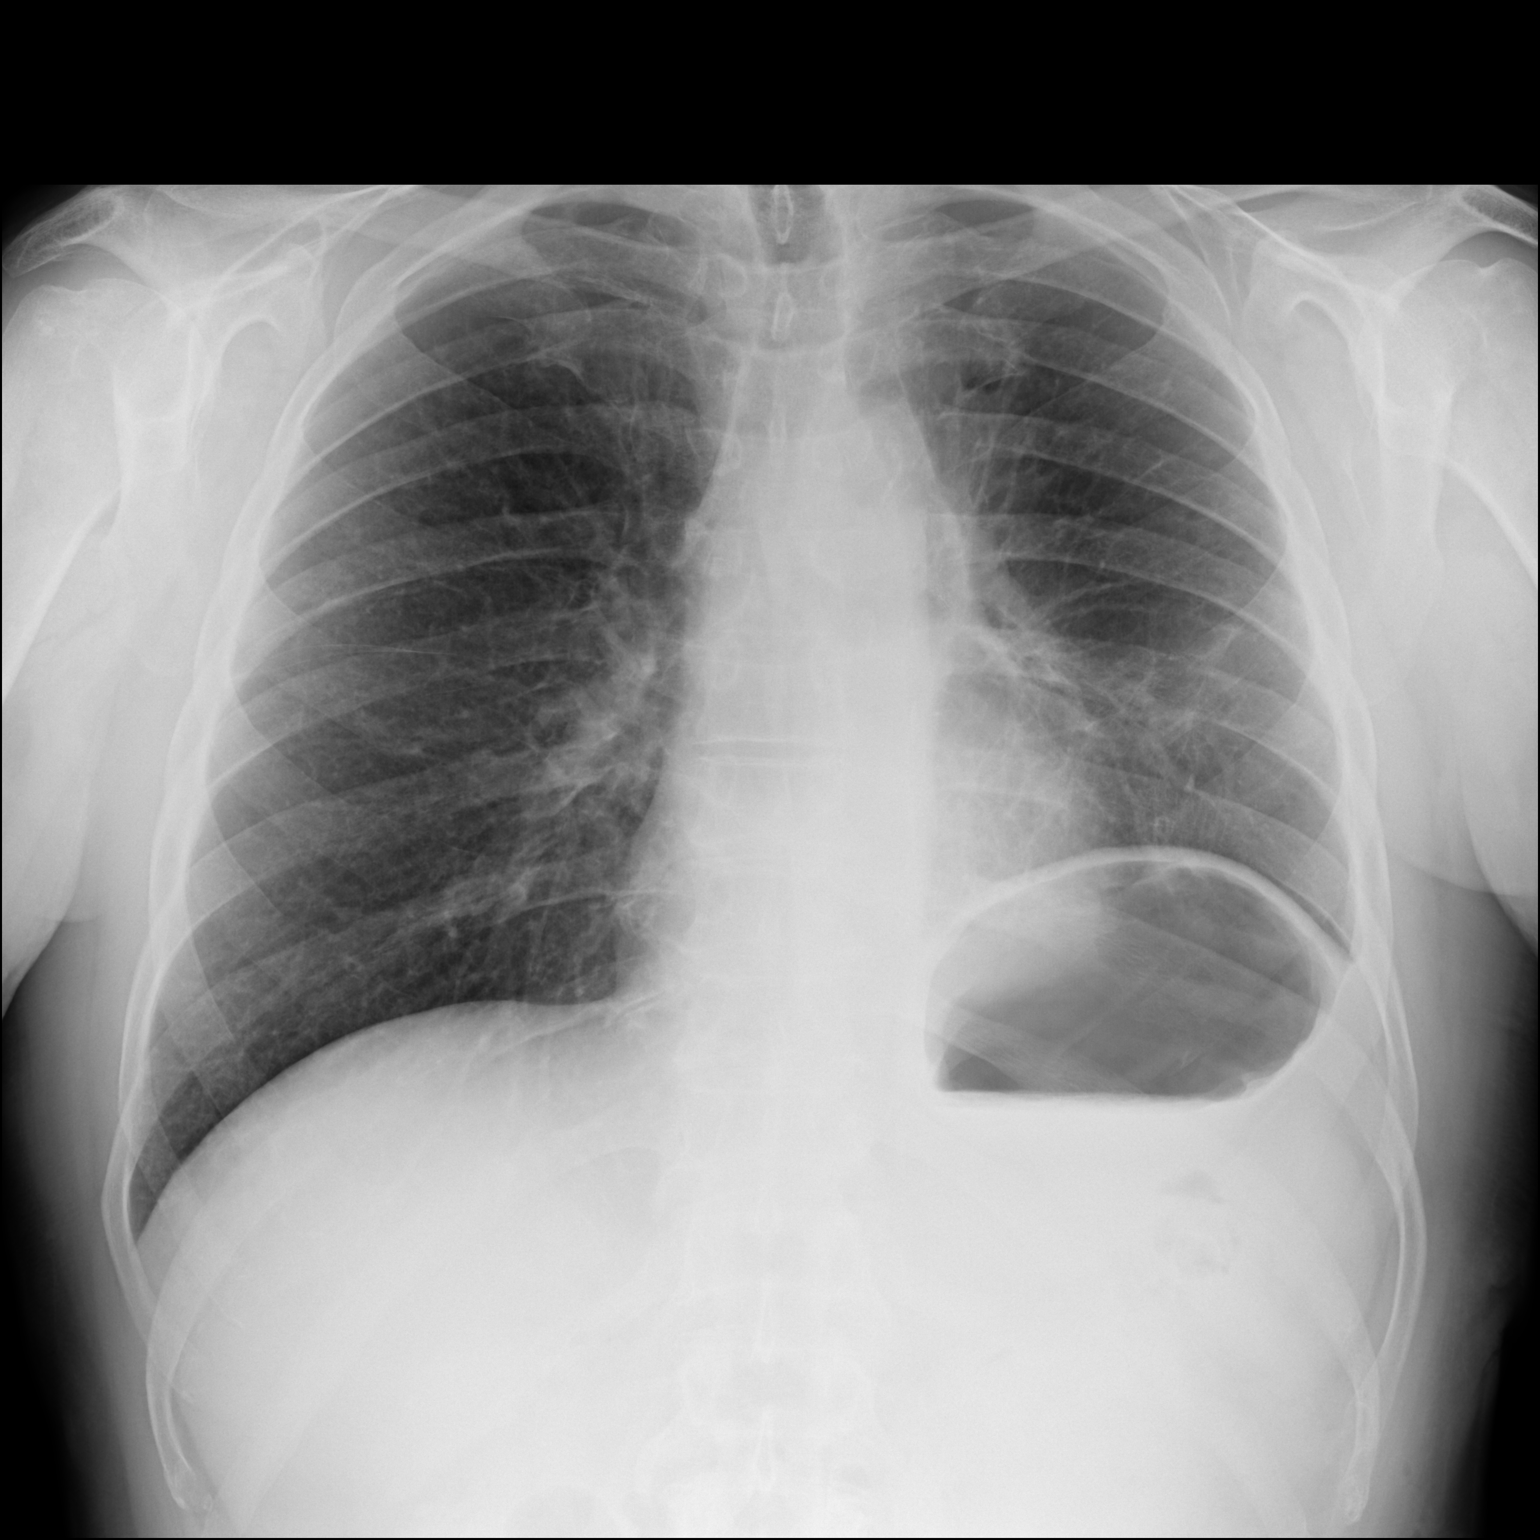

[dg chest 2 view (2 of 2)]
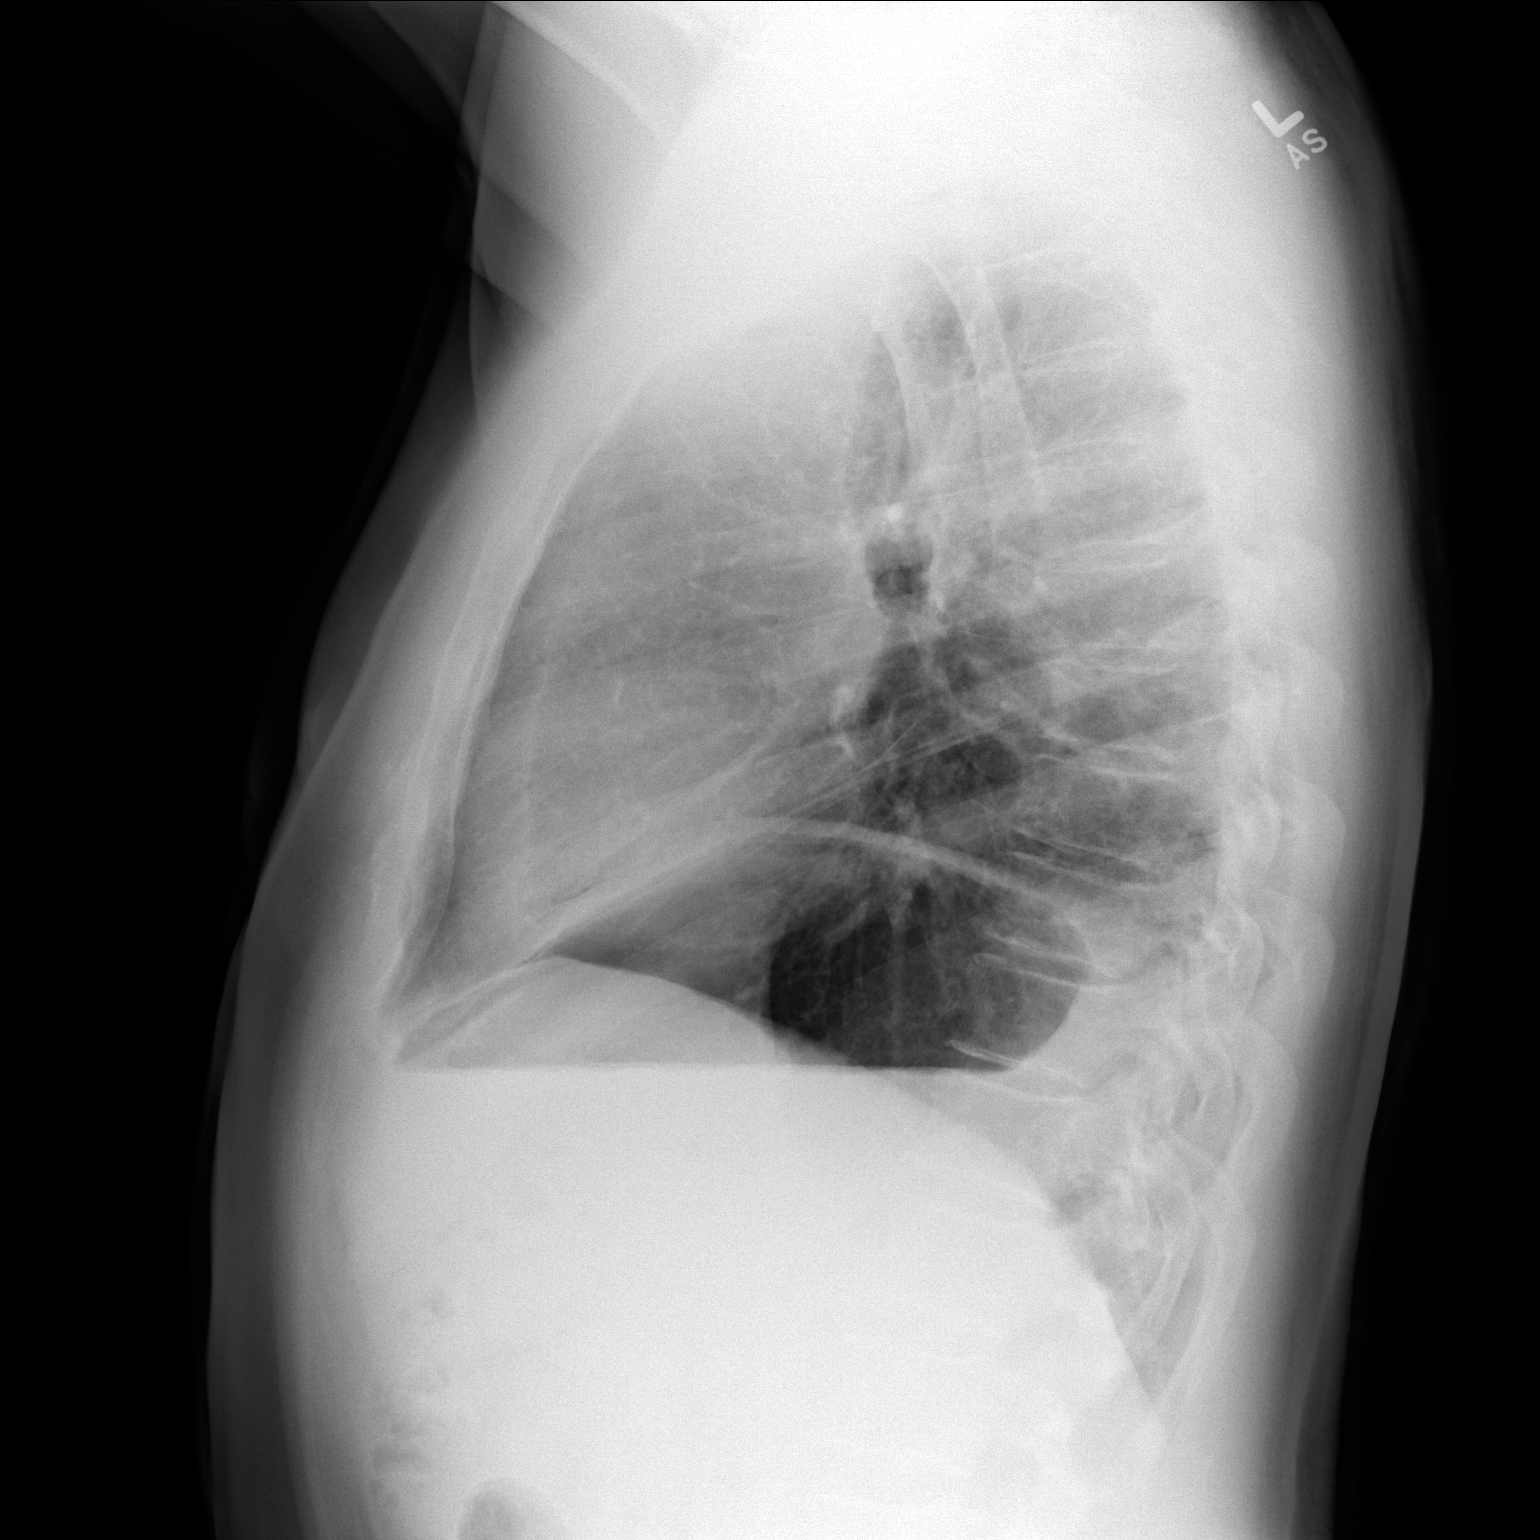

[2 of 2 positions shown; findings below may reference images not displayed]

FINDINGS: Elevation of the right diaphragm. Postsurgical changes at the left
lung base with basilar atelectasis. Lungs are otherwise clear. No
pleural effusion or pneumothorax. Heart and mediastinal contours are
unremarkable.

No acute osseous abnormality.
IMPRESSION: 1. No active cardiopulmonary disease.
2. Elevation of the right diaphragm. Postsurgical changes at the
left lung base with basilar atelectasis.

## 2023-12-05 ENCOUNTER — Institutional Professional Consult (permissible substitution): Admitting: Cardiology

## 2023-12-06 ENCOUNTER — Encounter (HOSPITAL_BASED_OUTPATIENT_CLINIC_OR_DEPARTMENT_OTHER): Payer: Self-pay

## 2023-12-06 ENCOUNTER — Ambulatory Visit (INDEPENDENT_AMBULATORY_CARE_PROVIDER_SITE_OTHER)
Admit: 2023-12-06 | Discharge: 2023-12-06 | Disposition: A | Discharge status: Home / Self Care | Attending: Family Medicine | Admitting: Family Medicine

## 2023-12-06 ENCOUNTER — Ambulatory Visit (HOSPITAL_BASED_OUTPATIENT_CLINIC_OR_DEPARTMENT_OTHER): Admission: EM | Admit: 2023-12-06 | Discharge: 2023-12-06 | Disposition: A | Discharge status: Home / Self Care

## 2023-12-06 DIAGNOSIS — R42 Dizziness and giddiness: Secondary | ICD-10-CM | POA: Diagnosis not present

## 2023-12-06 DIAGNOSIS — C3492 Malignant neoplasm of unspecified part of left bronchus or lung: Secondary | ICD-10-CM

## 2023-12-06 DIAGNOSIS — R051 Acute cough: Secondary | ICD-10-CM

## 2023-12-06 LAB — POCT URINALYSIS DIP (MANUAL ENTRY)
Bilirubin, UA: NEGATIVE
Blood, UA: NEGATIVE
Glucose, UA: NEGATIVE mg/dL
Ketones, POC UA: NEGATIVE mg/dL
Leukocytes, UA: NEGATIVE
Nitrite, UA: NEGATIVE
Protein Ur, POC: NEGATIVE mg/dL
Spec Grav, UA: 1.015 (ref 1.010–1.025)
Urobilinogen, UA: 0.2 U/dL
pH, UA: 7 (ref 5.0–8.0)

## 2023-12-06 NOTE — ED Triage Notes (Signed)
 Pt c/o dizziness that began Sunday. Dizziness is worse upon standing but does occur at rest. Per pt's wife, he has had recurrent episodes of dizziness since chemo in 2023. Last was in March, during which he had a syncopal episode. Pt had full cardiac work up at [facility], which was negative for acute findings per wife. Pt states yesterday he fell d/t dizziness. Denies LOC. Denies hitting head or any other injury. Ground level fall on to carpet.  Wife states he has been staying hydrated.  Has had hx of hyponatremia.
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
## 2023-12-06 NOTE — Discharge Instructions (Addendum)
 Dizziness: Patient was negative for orthostatic changes with orthostatic evaluation.  His urinalysis looks normal and is not concentrated.  He appears to be well-hydrated.  He did not take his blood pressure pill this morning and seems possibly a little more stable, per his wife.  Stop lisinopril  2.5 mg daily.  Monitor blood pressure at home.  And monitor for the dizziness.  Continue good fluid hydration.    Cough and squamous cell carcinoma of the lungs, stage II left lung: Chest x-ray appears clear or normal at this time.  No further workup for now.  Follow-up with family practice provider or cardiology.

## 2023-12-15 ENCOUNTER — Telehealth: Payer: Self-pay | Admitting: Cardiology

## 2023-12-15 NOTE — Telephone Encounter (Signed)
 Wife is calling states the patient swelling in his hands and they hurt terrible bad. Wife states that his bp has been low as well. Calling to see if they need to come in for an appt. Please advise

## 2023-12-15 NOTE — Telephone Encounter (Signed)
 Called the patient's wife Lona Rist per DPR and she reported that the patient's hands were very swollen and painful. She also reported that his blood pressure has been running low. Yesterday at the doctor's office it was 90/50. Normally his blood pressure runs in the low 100's /80's. On 6/9 the patient fell in his house and was taken to urgent care. The wife also stated that he historically has a low sodium level usually running 129 - 127. Please advise

## 2023-12-15 NOTE — Telephone Encounter (Signed)
 Called the patients wife Lona Rist per DPR and informed her of Dr. Lupie Salle recommendation below:  Patient to go to the emergency room and get checked. Also call primary care ASAP.   Lona Rist verbalized understanding and had no further questions at this time.

## 2024-01-11 IMAGING — DX DG CHEST 2V
2 series · 2 of 2 positions shown · non-contrast
Comparison: September 23, 2021.

CLINICAL DATA: Lung cancer post resection history of squamous cell
carcinoma of the LEFT lung.

EXAM:
CHEST - 2 VIEW

[dg chest 2 view (1 of 2)]
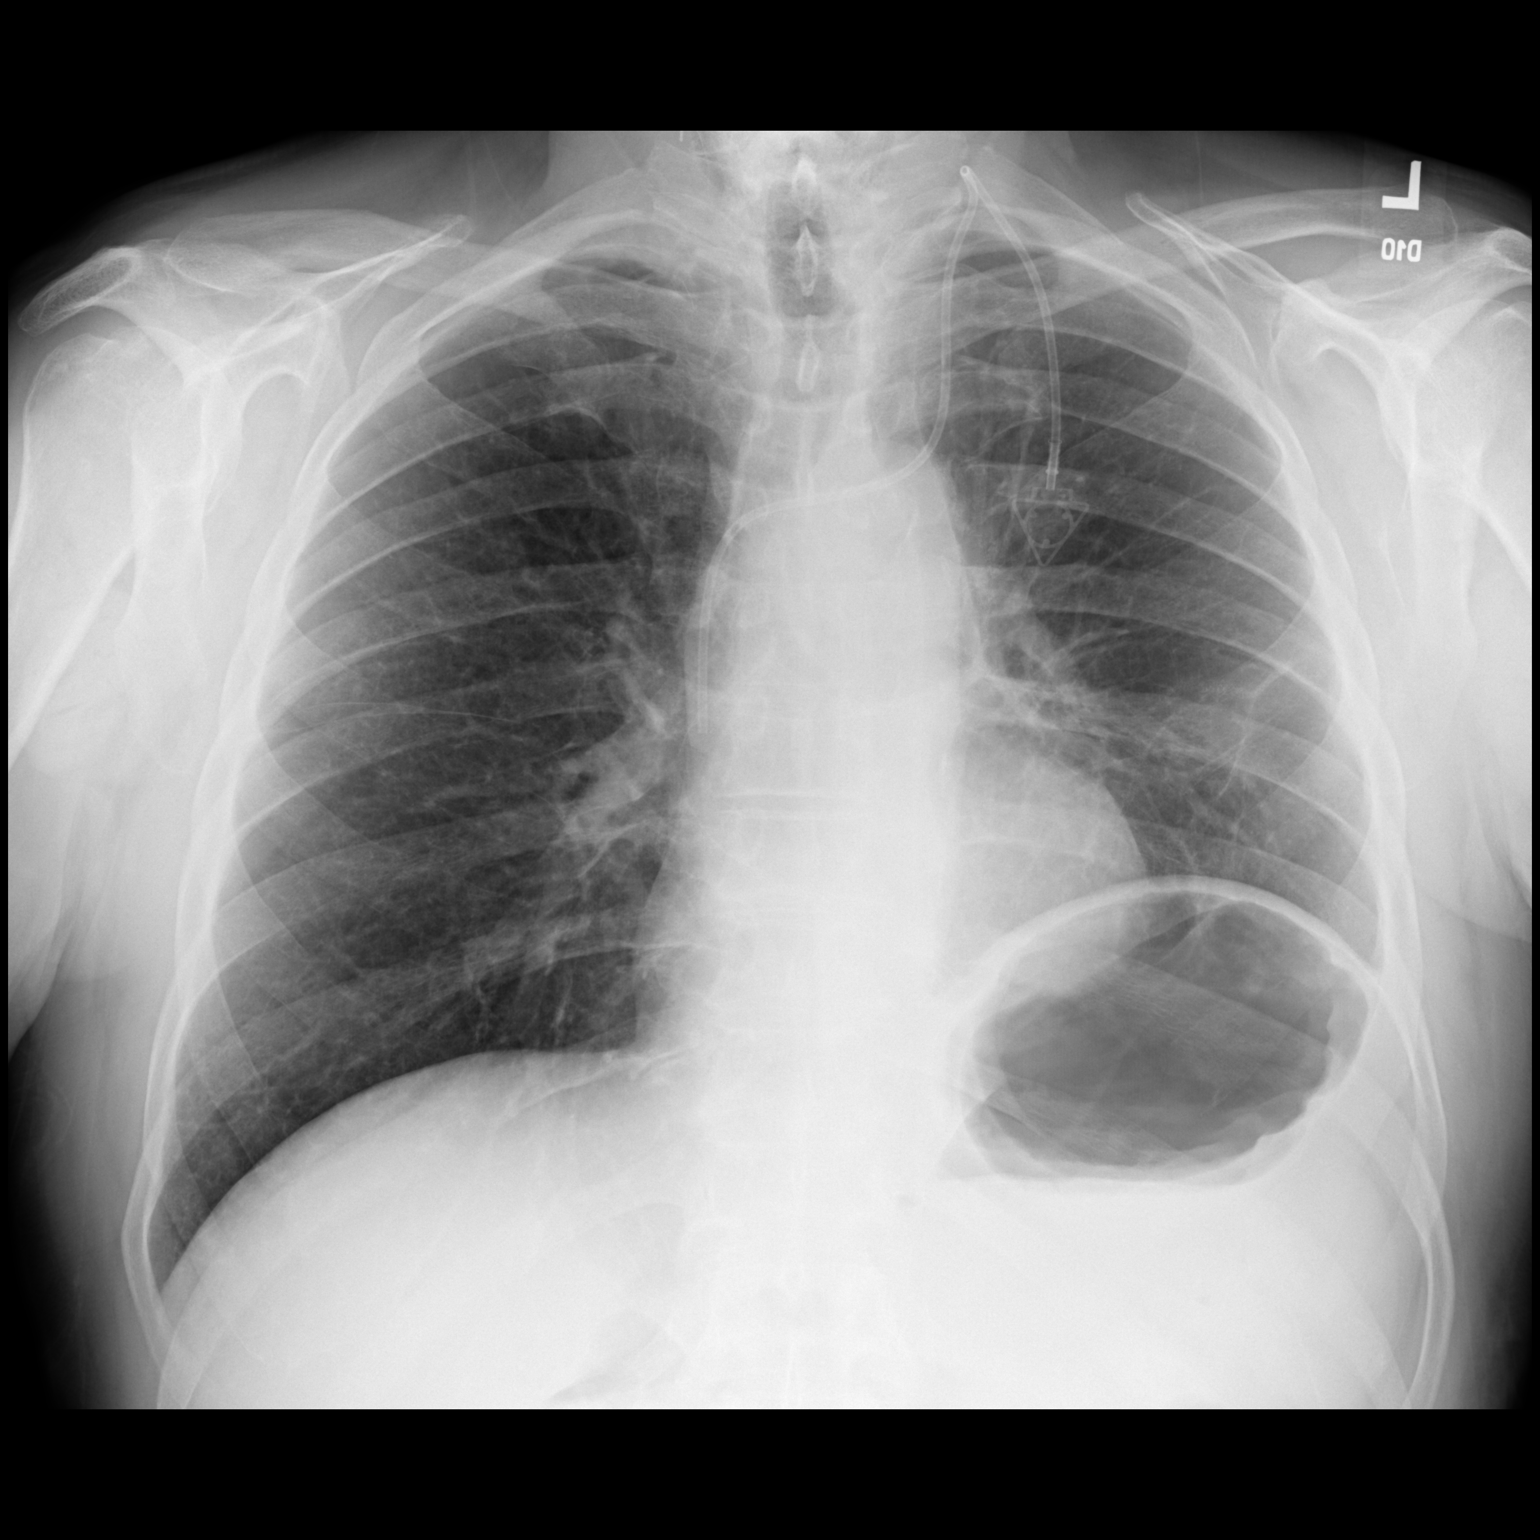

[dg chest 2 view (2 of 2)]
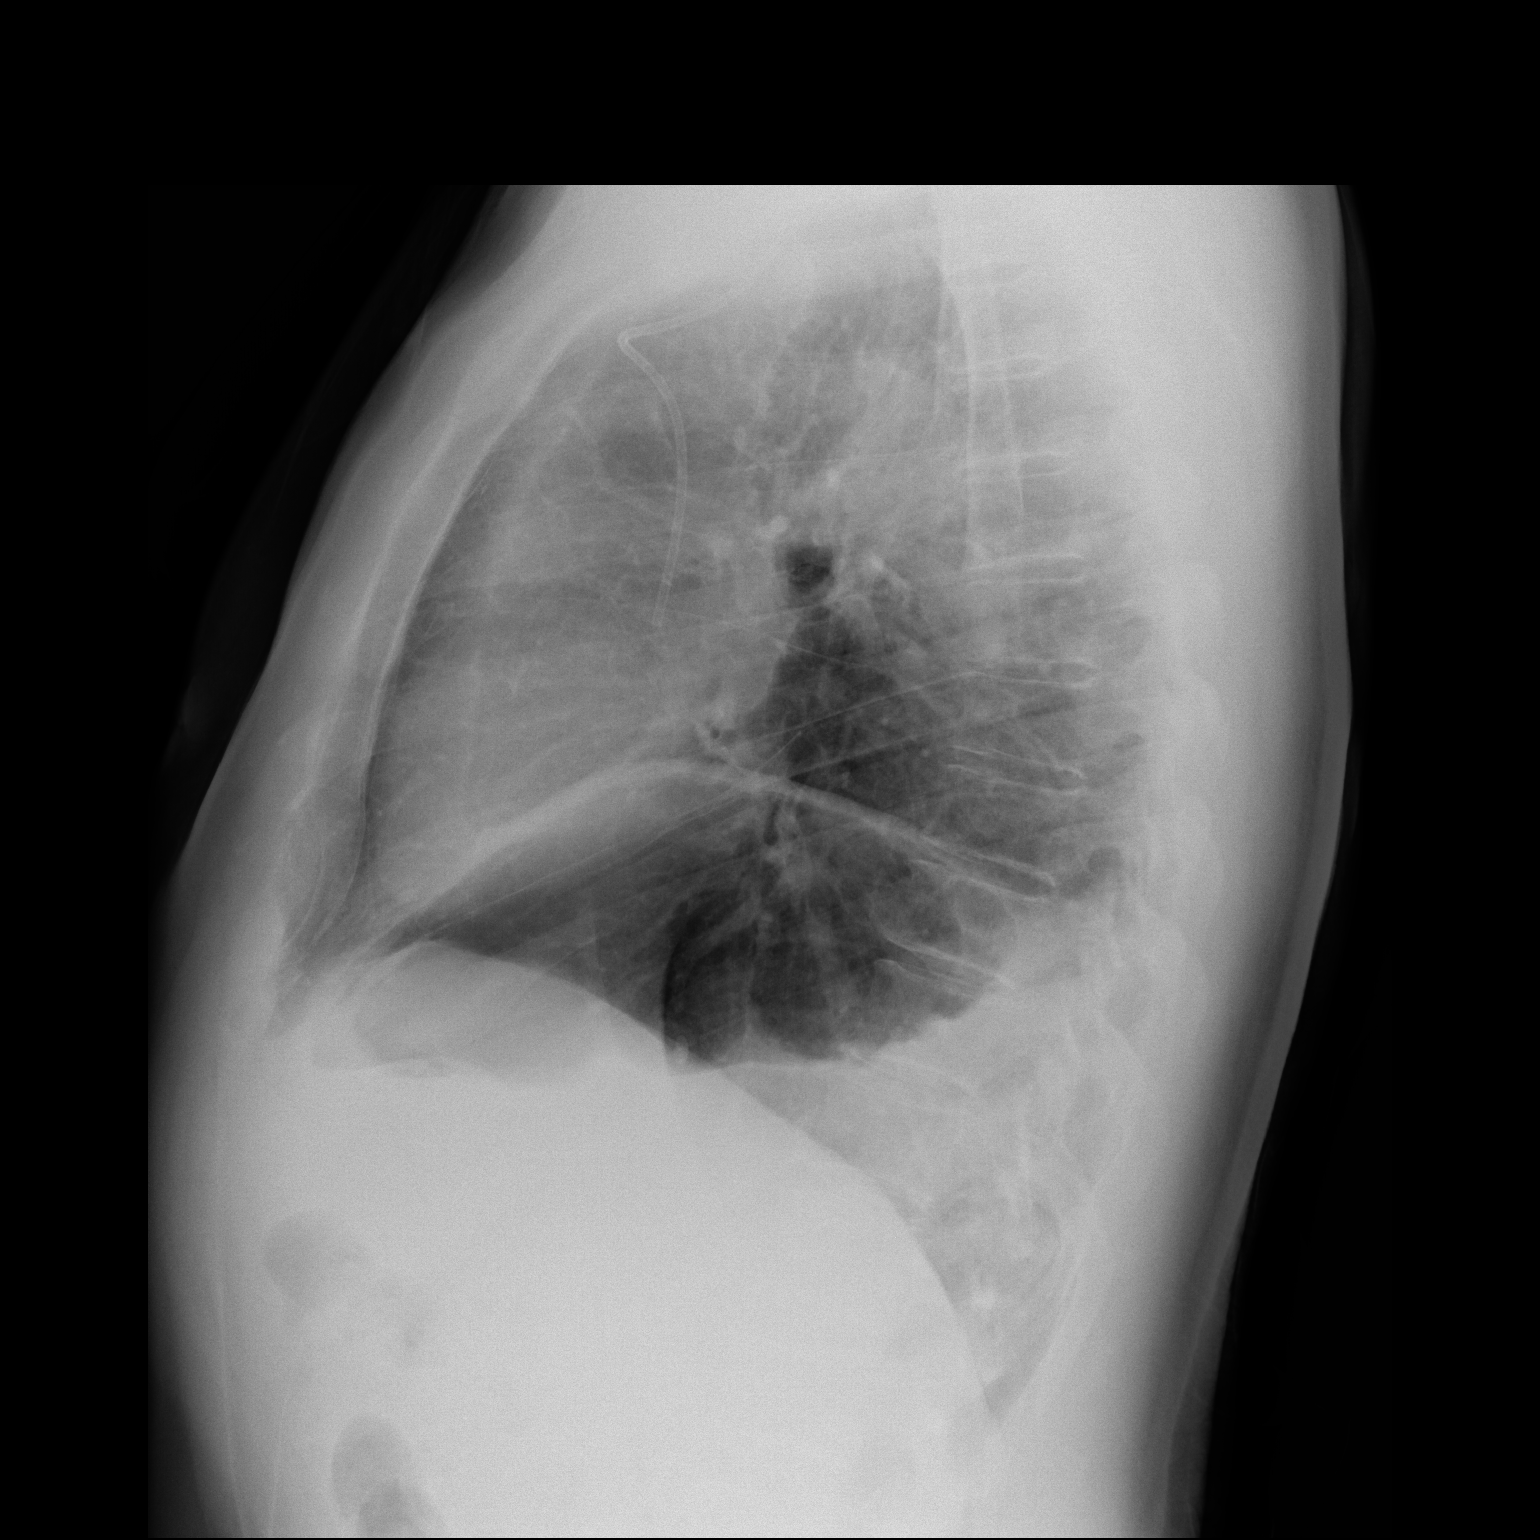

[2 of 2 positions shown; findings below may reference images not displayed]

FINDINGS: LEFT hemidiaphragm is elevated following partial lung resection in
the LEFT chest.

LEFT-sided Port-A-Cath remains in place tip at the area of the caval
to atrial junction.

Cardiomediastinal contours and hilar structures are stable. Lungs
are clear aside from minimal scarring or atelectasis in the LEFT
chest. No sign of pleural effusion.

On limited assessment there is no acute skeletal process.
IMPRESSION: Post partial lung resection in the LEFT chest. Resultant LEFT
hemidiaphragmatic elevation similar to prior imaging. No acute
findings.

## 2024-04-05 ENCOUNTER — Ambulatory Visit (INDEPENDENT_AMBULATORY_CARE_PROVIDER_SITE_OTHER): Admit: 2024-04-05 | Discharge: 2024-04-05 | Disposition: A | Discharge status: Home / Self Care | Admitting: Radiology

## 2024-04-05 ENCOUNTER — Ambulatory Visit (HOSPITAL_BASED_OUTPATIENT_CLINIC_OR_DEPARTMENT_OTHER): Payer: Self-pay | Admitting: Family Medicine

## 2024-04-05 ENCOUNTER — Encounter (HOSPITAL_BASED_OUTPATIENT_CLINIC_OR_DEPARTMENT_OTHER): Payer: Self-pay | Admitting: Emergency Medicine

## 2024-04-05 ENCOUNTER — Ambulatory Visit (HOSPITAL_BASED_OUTPATIENT_CLINIC_OR_DEPARTMENT_OTHER)
Admission: EM | Admit: 2024-04-05 | Discharge: 2024-04-05 | Disposition: A | Discharge status: Home / Self Care | Attending: Family Medicine | Admitting: Family Medicine

## 2024-04-05 DIAGNOSIS — M79641 Pain in right hand: Secondary | ICD-10-CM

## 2024-04-05 DIAGNOSIS — M7989 Other specified soft tissue disorders: Secondary | ICD-10-CM

## 2024-04-05 DIAGNOSIS — M79642 Pain in left hand: Secondary | ICD-10-CM

## 2024-04-05 DIAGNOSIS — W010XXA Fall on same level from slipping, tripping and stumbling without subsequent striking against object, initial encounter: Secondary | ICD-10-CM

## 2024-04-05 DIAGNOSIS — S0031XA Abrasion of nose, initial encounter: Secondary | ICD-10-CM | POA: Diagnosis not present

## 2024-04-05 MED ORDER — CELECOXIB 200 MG PO CAPS: 200.0000 mg | ORAL_CAPSULE | Freq: Two times a day (BID) | ORAL | 0 refills | Status: AC | PRN

## 2024-04-05 MED ORDER — MUPIROCIN 2 % EX OINT: 1.0000 | TOPICAL_OINTMENT | Freq: Two times a day (BID) | CUTANEOUS | 0 refills | Status: AC

## 2024-04-05 NOTE — Progress Notes (Signed)
 X-ray shows arthritic changes and some soft tissue swelling in the right hand.  He has some arthritis and also some metallic densities in the left hand that are secondary to previous surgery.  Patient and his wife were updated on these results during the visit.  They will try Celebrex for pain.  Follow-up with primary care as needed and may need to see orthopedics if pain persists.
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
## 2024-04-05 NOTE — Discharge Instructions (Addendum)
 Pain and swelling of both hands that was worse after a fall earlier today: Try Celebrex, 200 mg, 1 pill once or twice daily if needed for hand pain.  X-rays show some arthritis but are otherwise negative.   Abrasions to nose: Clean with warm soapy fingers, rinse, pat dry, apply mupirocin ointment.  Follow-up with primary care or return here if there is any worsening redness, worsening pain, fever or discharge with an odor from the abrasions on the nose.  These could be signs of infection and indication that he needs to return for possible antibiotic therapy.  Contact a health care provider if: You got a tetanus shot, and you have swelling, severe pain, redness, or bleeding at the site of your shot. Your pain is worse and medicines do not help. You have a fever. You have any of signs of infection. Get help right away if: You have a red streak spreading away from your wound.

## 2024-04-05 NOTE — ED Triage Notes (Signed)
 Pt c/o bilateral fingers swelling, wife stated it has happened before, pt did recently fall today, he reports hands were already hurting before the fall.

## 2024-06-25 ENCOUNTER — Ambulatory Visit

## 2024-06-25 VITALS — BP 119/69 | HR 90 | Temp 97.8°F | Resp 16 | Ht 69.0 in | Wt 179.2 lb

## 2024-06-25 DIAGNOSIS — C3492 Malignant neoplasm of unspecified part of left bronchus or lung: Secondary | ICD-10-CM

## 2024-06-25 DIAGNOSIS — R7982 Elevated C-reactive protein (CRP): Secondary | ICD-10-CM

## 2024-06-25 DIAGNOSIS — M1380 Other specified arthritis, unspecified site: Secondary | ICD-10-CM | POA: Diagnosis not present

## 2024-06-25 DIAGNOSIS — Z79899 Other long term (current) drug therapy: Secondary | ICD-10-CM

## 2024-06-25 MED ORDER — HYDROXYCHLOROQUINE SULFATE 200 MG PO TABS: 200.0000 mg | ORAL_TABLET | Freq: Two times a day (BID) | ORAL | 0 refills | Status: AC
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
## 2024-06-26 LAB — CREATININE, SERUM: Creat: 1.08 mg/dL (ref 0.70–1.22)

## 2024-06-26 LAB — CBC
HCT: 33.8 % — ABNORMAL LOW (ref 39.4–51.1)
Hemoglobin: 11 g/dL — ABNORMAL LOW (ref 13.2–17.1)
MCH: 31.1 pg (ref 27.0–33.0)
MCHC: 32.5 g/dL (ref 31.6–35.4)
MCV: 95.5 fL (ref 81.4–101.7)
MPV: 9.5 fL (ref 7.5–12.5)
Platelets: 248 Thousand/uL (ref 140–400)
RBC: 3.54 Million/uL — ABNORMAL LOW (ref 4.20–5.80)
RDW: 14.8 % (ref 11.0–15.0)
WBC: 5.1 Thousand/uL (ref 3.8–10.8)

## 2024-06-26 LAB — ALT: ALT: 13 U/L (ref 9–46)

## 2024-06-26 LAB — AST: AST: 12 U/L (ref 10–35)

## 2024-07-19 ENCOUNTER — Ambulatory Visit: Payer: Self-pay

## 2024-09-24 ENCOUNTER — Ambulatory Visit
